# Patient Record
Sex: Male | Born: 1935 | Race: Black or African American | Hispanic: No | Smoking: Never smoker
Health system: Southern US, Community
[De-identification: ages and names within clinical notes are randomized; demographics above are authoritative.]

## PROBLEM LIST (undated history)

## (undated) DIAGNOSIS — I2510 Atherosclerotic heart disease of native coronary artery without angina pectoris: Secondary | ICD-10-CM

## (undated) DIAGNOSIS — I10 Essential (primary) hypertension: Secondary | ICD-10-CM

## (undated) DIAGNOSIS — E785 Hyperlipidemia, unspecified: Secondary | ICD-10-CM

## (undated) HISTORY — DX: Atherosclerotic heart disease of native coronary artery without angina pectoris: I25.10

## (undated) HISTORY — DX: Hyperlipidemia, unspecified: E78.5

---

## 1994-07-04 DIAGNOSIS — I2510 Atherosclerotic heart disease of native coronary artery without angina pectoris: Secondary | ICD-10-CM

## 1994-07-04 HISTORY — DX: Atherosclerotic heart disease of native coronary artery without angina pectoris: I25.10

## 1994-07-04 HISTORY — PX: CORONARY ARTERY BYPASS GRAFT: SHX141

## 2013-02-28 ENCOUNTER — Other Ambulatory Visit: Payer: Self-pay | Admitting: *Deleted

## 2013-02-28 MED ORDER — AMLODIPINE BESYLATE 5 MG PO TABS: 7.5000 mg | ORAL_TABLET | Freq: Every day | ORAL | Status: DC

## 2013-02-28 NOTE — Telephone Encounter (Signed)
Rx was sent to pharmacy electronically via [employer]

## 2013-03-08 ENCOUNTER — Other Ambulatory Visit: Payer: Self-pay | Admitting: *Deleted

## 2013-03-08 MED ORDER — ROSUVASTATIN CALCIUM 10 MG PO TABS: 10.0000 mg | ORAL_TABLET | Freq: Every day | ORAL | Status: DC

## 2013-03-08 NOTE — Telephone Encounter (Signed)
Rx was sent to pharmacy electronically via [employer]

## 2013-04-30 ENCOUNTER — Ambulatory Visit (INDEPENDENT_AMBULATORY_CARE_PROVIDER_SITE_OTHER): Payer: Medicare Other | Admitting: Cardiovascular Disease

## 2013-04-30 ENCOUNTER — Encounter: Payer: Self-pay | Admitting: Cardiovascular Disease

## 2013-04-30 VITALS — BP 138/90 | HR 63 | Ht 66.0 in | Wt 141.3 lb

## 2013-04-30 DIAGNOSIS — I2510 Atherosclerotic heart disease of native coronary artery without angina pectoris: Secondary | ICD-10-CM

## 2013-04-30 DIAGNOSIS — E785 Hyperlipidemia, unspecified: Secondary | ICD-10-CM

## 2013-04-30 DIAGNOSIS — I10 Essential (primary) hypertension: Secondary | ICD-10-CM

## 2013-04-30 DIAGNOSIS — R972 Elevated prostate specific antigen [PSA]: Secondary | ICD-10-CM

## 2013-04-30 NOTE — Patient Instructions (Signed)
Your physician has requested that you have a lexiscan myoview and office appointment in 6 MONTHS.

## 2013-06-02 ENCOUNTER — Encounter: Payer: Self-pay | Admitting: Cardiovascular Disease

## 2013-06-02 DIAGNOSIS — E785 Hyperlipidemia, unspecified: Secondary | ICD-10-CM | POA: Insufficient documentation

## 2013-06-02 DIAGNOSIS — I10 Essential (primary) hypertension: Secondary | ICD-10-CM | POA: Insufficient documentation

## 2013-06-02 DIAGNOSIS — R972 Elevated prostate specific antigen [PSA]: Secondary | ICD-10-CM | POA: Insufficient documentation

## 2013-06-02 DIAGNOSIS — I2510 Atherosclerotic heart disease of native coronary artery without angina pectoris: Secondary | ICD-10-CM | POA: Insufficient documentation

## 2013-06-02 NOTE — Progress Notes (Signed)
Patient ID: Devin Hunter, male   DOB: 1919-11-11, 79 y.o.   MRN: [id number]     PATIENT PROFILE:  Devin Hunter a 79 year old male who is a patient of Dr. Marella Chimes said and had previously seen Dr. Rollene Fare. He presents to the office today to establish cardiology care with me after Dr. Lowella Fairy retirement from his solo practice.   HPI: Devin Hunter is a 79 year old gentleman who has a history of hypertension, hyperlipidemia, and coronary artery disease. He underwent CABG revascularization surgery in 1996 and had a LIMA placed to his LAD, SVG to the OM, and SVG to the PDA. Preoperative ejection fraction was 45%. His last nuclear perfusion study was in April 2011 which revealed normal perfusion. His last echo Doppler study was April 2013 showed mild concentric LVH with grade 1 diastolic dysfunction and normal LV function with ejection fraction greater than 55%. He didn't mild mitral annular calcification, moderate tricuspid regurgitation, mild aortic valve sclerosis. He didn't mild pulmonary hypertension with an estimated RV systolic pressure of 40 mm.  Devin Hunter has continued to be very active. He still is ambulatory and over the past year was actually climbing up to his roof to clean trees off his roof. He denies any recurrent chest pain symptoms. He walks 2 miles per day. He last saw Dr. Rollene Fare in March 2014. He presents to the office today to establish neurologic care with me.  Past surgical history is notable for CABG surgery as noted above in 1996.  Past medical history is notable for hypertension, hyperlipidemia, CAD, as well as elevated PSA for which he is followed by Dr. Mar Daring   Current Outpatient Prescriptions  Medication Sig Dispense Refill  . amLODipine (NORVASC) 5 MG tablet Take 1.5 tablets (7.5 mg total) by mouth daily.  45 tablet  7  . aspirin 81 MG tablet Take 81 mg by mouth daily.      . enalapril (VASOTEC) 20 MG tablet Take 1 tablet by mouth daily.      . fish oil-omega-3  fatty acids 1000 MG capsule Take 1 g by mouth daily.      . metoprolol tartrate (LOPRESSOR) 25 MG tablet Take 1 tablet by mouth 2 (two) times daily.      . rosuvastatin (CRESTOR) 10 MG tablet Take 1 tablet (10 mg total) by mouth daily.  30 tablet  7  . vardenafil (LEVITRA) 2.5 MG tablet Take 2.5 mg by mouth daily as needed for erectile dysfunction.       No current facility-administered medications for this visit.    Socially he is widowed. He had 5 children, 3 deceased, 20 grandchildren and 7 great-grandchildren. There is no history of tobacco use.  History reviewed. No pertinent family history.  ROS is negative for fever chills or night sweats. He denies changes to skin. He denies visual changes. He denies changes to curing. He denies cough or increased sputum or congestion. There is no wheezing. He denies presyncope or syncope. He is unaware lymphadenopathy. He denies anginal symptoms. He denies abdominal pain, nausea vomiting. He denies blood in stool or urine. He does intermittently use Levitra erectile function movement. He does have a history of hyperlipidemia. He denies myalgias. He denies paresthesias. He does have elevation of PSA in the past. He denies neurologic symptoms. He denies cold or heat intolerance. He denies any significant problems with sleep. Other comprehensive 14 point system review is negative.  PE BP 138/90  Pulse 63  Ht 5\' 6"  (1.676 m)  Wt  141 lb 4.8 oz (64.093 kg)  BMI 22.82 kg/m2 Repeat blood pressure when taken by me was 138/88 General: Alert, oriented, no distress. He appears at least 10-15 years younger than his stated age. Skin: normal turgor, no rashes HEENT: Normocephalic, atraumatic. Pupils round and reactive; sclera anicteric; Fundi mild arteriolar narrowing without hemorrhages or exudates. Nose without nasal septal hypertrophy Mouth/Parynx benign; Mallinpatti scale 2 Neck: No JVD, no carotid bruits Lungs: clear to ausculatation and percussion; no  wheezing or rales Heart: RRR, s1 s2 normal 1/6 systolic murmur Abdomen: soft, nontender; no hepatosplenomehaly, BS+; abdominal aorta nontender and not dilated by palpation. Pulses 2+ Extremities: no clubbinbg cyanosis or edema, Homan's sign negative  Neurologic: grossly nonfocal Psychologic: Normal mood and affect   ECG: Normal sinus rhythm with mild sinus arrhythmia. Normal intervals.  LABS:  BMET No results found for this basename: na, k, cl, co2, glucose, bun, creatinine, calcium, gfrnonaa, gfraa     Hepatic Function Panel  No results found for this basename: prot, albumin, ast, alt, alkphos, bilitot, bilidir, ibili     CBC No results found for this basename: wbc, rbc, hgb, hct, plt, mcv, mch, mchc, rdw, neutrabs, lymphsabs, monoabs, eosabs, basosabs     BNP No results found for this basename: probnp    Lipid Panel  No results found for this basename: chol, trig, hdl, cholhdl, vldl, ldlcalc     RADIOLOGY: No results found.   ASSESSMENT AND PLAN: My impression is that Devin Hunter is a very healthy-appearing 79 year old gentleman who appears at least 10-15 years younger than his stated age. He has undergone CABG revascularization surgery 18 years ago in 1996 and his last nuclear perfusion study in April 2011 continued to show normal perfusion. I did review the laboratory that he had done in March 2004. BUN was 15 creatinine 1.2. Total cholesterol was 149 triglycerides 61 HDL 46 LDL 91. Calcium was minimally elevated at 10.8. Devin Hunter is walking at least 2 miles per day. He remains very active. His blood pressure today is stable. In April 2015 I am recommending he undergo a 4 year followup nuclear perfusion study. I will also check laboratory that time one year after his last laboratory assessment. I'll see him back in the office for followup and further recommendations were made at that time.   Troy Sine, MD, [facility] 06/02/2013 8:58 AM

## 2013-06-03 ENCOUNTER — Encounter: Payer: Self-pay | Admitting: Cardiovascular Disease

## 2013-08-19 ENCOUNTER — Other Ambulatory Visit: Payer: Self-pay | Admitting: *Deleted

## 2013-08-19 MED ORDER — ENALAPRIL MALEATE 20 MG PO TABS: 20.0000 mg | ORAL_TABLET | Freq: Every day | ORAL | Status: DC

## 2013-08-19 NOTE — Telephone Encounter (Signed)
Rx was sent to pharmacy electronically. 

## 2013-08-26 ENCOUNTER — Telehealth: Payer: Self-pay | Admitting: *Deleted

## 2013-08-26 MED ORDER — ENALAPRIL MALEATE 20 MG PO TABS: 20.0000 mg | ORAL_TABLET | Freq: Every day | ORAL | Status: DC

## 2013-08-26 NOTE — Telephone Encounter (Signed)
Spoke w/ Dr. Claiborne Billings and he advised pt should be on enalapril 20 mg daily.  Rx sent to pharmacy.  Call to pt and no answer.  RN previously advised pt to check w/ pharmacy.

## 2013-08-26 NOTE — Telephone Encounter (Signed)
Walk-In Message r/t problems getting Rx called in.  Returned call.  Pt stated he needs a refill on amlodipine (spelled).  Pt confirmed dose.  Informed refill would be sent.  Pt also informed refill request was sent to [facility] for enalapril.  Pt stated he doesn't have that bottle and hasn't been taking it.  Pt informed RN will call pharmacy to review meds to make sure right Rx sent to pharmacy.  Pt stated [facility] not use [facility], but [facility].  Call to [facility] and meds reviewed.  Informed pt filled amlodipine on 2.9.15 and request for enalapril 20 mg (3 tabs daily) was sent last week w/o response.  Stated pt does not need amlodipine.  Informed pharmacist that refill was sent for enalapril 20 mg daily to [facility].  Pharmacist stated pt has been consistently filling Rxs there and only by Dr. Rollene Fare.  Informed RN will clarify dose of enalapril and send back electronically.  Verbalized understanding.  Call to pt and informed RN will get Rx situated and send in correct refill.  Pt advised to check w/ pharmacy in a couple of hours before arriving to pick up Rx.  Pt verbalized understanding and agreed w/ plan.  This message and notes left on Dr. Evette Georges cart for review and advice.

## 2013-09-25 ENCOUNTER — Other Ambulatory Visit: Payer: Self-pay | Admitting: *Deleted

## 2013-09-25 MED ORDER — ENALAPRIL MALEATE 20 MG PO TABS: 20.0000 mg | ORAL_TABLET | Freq: Every day | ORAL | Status: DC

## 2013-11-11 ENCOUNTER — Other Ambulatory Visit: Payer: Self-pay

## 2013-11-11 MED ORDER — ROSUVASTATIN CALCIUM 10 MG PO TABS: 10.0000 mg | ORAL_TABLET | Freq: Every day | ORAL | Status: DC

## 2013-11-11 NOTE — Telephone Encounter (Signed)
Rx was sent to pharmacy electronically. 

## 2014-03-20 ENCOUNTER — Other Ambulatory Visit: Payer: Self-pay | Admitting: *Deleted

## 2014-03-20 MED ORDER — AMLODIPINE BESYLATE 5 MG PO TABS: 7.5000 mg | ORAL_TABLET | Freq: Every day | ORAL | Status: DC

## 2014-04-07 ENCOUNTER — Other Ambulatory Visit: Payer: Self-pay | Admitting: *Deleted

## 2014-04-07 MED ORDER — METOPROLOL TARTRATE 25 MG PO TABS: 25.0000 mg | ORAL_TABLET | Freq: Two times a day (BID) | ORAL | Status: DC

## 2014-04-07 NOTE — Telephone Encounter (Signed)
Refill sent electronically  to the pharmacy

## 2014-04-18 ENCOUNTER — Other Ambulatory Visit: Payer: Self-pay

## 2014-04-18 MED ORDER — ENALAPRIL MALEATE 20 MG PO TABS: 20.0000 mg | ORAL_TABLET | Freq: Every day | ORAL | Status: DC

## 2014-04-18 NOTE — Telephone Encounter (Signed)
Rx sent to pharmacy   

## 2014-05-14 ENCOUNTER — Other Ambulatory Visit: Payer: Self-pay

## 2014-05-14 MED ORDER — ENALAPRIL MALEATE 20 MG PO TABS: 20.0000 mg | ORAL_TABLET | Freq: Every day | ORAL | Status: DC

## 2014-05-14 NOTE — Telephone Encounter (Signed)
Rx sent to pharmacy   

## 2014-05-15 ENCOUNTER — Ambulatory Visit (INDEPENDENT_AMBULATORY_CARE_PROVIDER_SITE_OTHER): Payer: Medicare Other | Admitting: Cardiovascular Disease

## 2014-05-15 ENCOUNTER — Encounter: Payer: Self-pay | Admitting: Cardiovascular Disease

## 2014-05-15 VITALS — BP 120/80 | HR 61 | Ht 68.0 in | Wt 146.1 lb

## 2014-05-15 DIAGNOSIS — Z79899 Other long term (current) drug therapy: Secondary | ICD-10-CM

## 2014-05-15 DIAGNOSIS — E785 Hyperlipidemia, unspecified: Secondary | ICD-10-CM

## 2014-05-15 DIAGNOSIS — N529 Male erectile dysfunction, unspecified: Secondary | ICD-10-CM | POA: Insufficient documentation

## 2014-05-15 DIAGNOSIS — R972 Elevated prostate specific antigen [PSA]: Secondary | ICD-10-CM

## 2014-05-15 DIAGNOSIS — E782 Mixed hyperlipidemia: Secondary | ICD-10-CM

## 2014-05-15 DIAGNOSIS — I2510 Atherosclerotic heart disease of native coronary artery without angina pectoris: Secondary | ICD-10-CM

## 2014-05-15 DIAGNOSIS — I10 Essential (primary) hypertension: Secondary | ICD-10-CM

## 2014-05-15 LAB — CBC
HEMATOCRIT: 42.6 % (ref 39.0–52.0)
Hemoglobin: 13.9 g/dL (ref 13.0–17.0)
MCH: 29 pg (ref 26.0–34.0)
MCHC: 32.6 g/dL (ref 30.0–36.0)
MCV: 88.9 fL (ref 78.0–100.0)
Platelets: 323 10*3/uL (ref 150–400)
RBC: 4.79 MIL/uL (ref 4.22–5.81)
RDW: 14.1 % (ref 11.5–15.5)
WBC: 9.2 10*3/uL (ref 4.0–10.5)

## 2014-05-15 NOTE — Progress Notes (Signed)
Patient ID: Devin Hunter, male   DOB: 11-07-1919, 80 y.o.   MRN: [id number]     HPI:  Devin Hunter is a 80 year old AA male who presents for one-year cardiology follow-up evaluation.   Devin Hunter has a history of hypertension, hyperlipidemia, and coronary artery disease. He underwent CABG revascularization surgery in 1996 (LIMA placed to his LAD, SVG to the OM, and SVG to the PDA). Preoperative ejection fraction was 45%. His last nuclear perfusion study was in April 2011 which revealed normal perfusion. His last echo Doppler study was April 2013 showed mild concentric LVH with grade 1 diastolic dysfunction and normal LV function with ejection fraction greater than 55%. He didn't mild mitral annular calcification, moderate tricuspid regurgitation, mild aortic valve sclerosis. He didn't mild pulmonary hypertension with an estimated RV systolic pressure of 40 mm.  Devin Hunter has continued to be very active. He walks at least 2 miles a day at least 3 days per week.  Last year he was climbing up on his roof to clean trees off his roof.  He denies any change in exercise capacity.  He denies chest pain.  He denies palpitations.  He denies exertional shortness of breath.  He is unaware of wheezing.  Past surgical history is notable for CABG surgery as noted above in 1996.  Past medical history is notable for hypertension, hyperlipidemia, CAD, as well as elevated PSA for which he is followed by Dr. Mar Daring   Current Outpatient Prescriptions  Medication Sig Dispense Refill  . amLODipine (NORVASC) 5 MG tablet Take 1.5 tablets (7.5 mg total) by mouth daily. NEED AN APPOINTMENT BEFORE NEXT REFILL. 45 tablet 6  . aspirin 81 MG tablet Take 81 mg by mouth daily.    . enalapril (VASOTEC) 20 MG tablet Take 1 tablet (20 mg total) by mouth daily. 15 tablet 0  . fish oil-omega-3 fatty acids 1000 MG capsule Take 1 g by mouth daily.    . metoprolol tartrate (LOPRESSOR) 25 MG tablet Take 1 tablet (25 mg total) by  mouth 2 (two) times daily. Pt MUST keep appointment for future refills. 60 tablet 1  . rosuvastatin (CRESTOR) 10 MG tablet Take 1 tablet (10 mg total) by mouth daily. 30 tablet 5  . vardenafil (LEVITRA) 2.5 MG tablet Take 2.5 mg by mouth daily as needed for erectile dysfunction.     No current facility-administered medications for this visit.    Socially he is widowed. He had 5 children, 3 deceased, 20 grandchildren and 7 great-grandchildren. There is no history of tobacco use.  History reviewed. No pertinent family history.  ROS General: Negative; No fevers, chills, or night sweats;  HEENT: Negative; No changes in vision or hearing, sinus congestion, difficulty swallowing Pulmonary: Negative; No cough, wheezing, shortness of breath, hemoptysis Cardiovascular: Negative; No chest pain, presyncope, syncope, palpitations GI: Negative; No nausea, vomiting, diarrhea, or abdominal pain GU: erectile dysfunction for which she uses Levitra on an as-needed basis; No dysuria, hematuria, or difficulty voiding Musculoskeletal: Negative; no myalgias, joint pain, or weakness Hematologic/Oncology: Negative; no easy bruising, bleeding Endocrine: Negative; no heat/cold intolerance; no diabetes Neuro: Negative; no changes in balance, headaches Skin: Negative; No rashes or skin lesions Psychiatric: Negative; No behavioral problems, depression Sleep: Negative; No snoring, daytime sleepiness, hypersomnolence, bruxism, restless legs, hypnogognic hallucinations, no cataplexy Other comprehensive 14 point system review is negative.   PE BP 120/80 mmHg  Pulse 61  Ht 5\' 8"  (1.727 m)  Wt 146 lb 1.6 oz (66.271 kg)  BMI  22.22 kg/m2 Repeat blood pressure when taken by me was 130/74 General: Alert, oriented, no distress. He appears significantly younger than his stated age. Skin: normal turgor, no rashes HEENT: Normocephalic, atraumatic. Pupils round and reactive; sclera anicteric; Fundi mild arteriolar  narrowing without hemorrhages or exudates. Nose without nasal septal hypertrophy Mouth/Parynx benign; Mallinpatti scale 2 Neck: No JVD, no carotid bruits; normal carotid upstroke Lungs: clear to ausculatation and percussion; no wheezing or rales Chest wall: Nontender to palpation Heart: RRR, s1 s2 normal 1/6 systolic murmur; .  No diastolic murmur.  No S3 gallop.  No rubs thrills or heaves. Abdomen: soft, nontender; no hepatosplenomehaly, BS+; abdominal aorta nontender and not dilated by palpation. Back: No CVA tenderness Pulses 2+ Extremities: no clubbinbg cyanosis or edema, Homan's sign negative  Neurologic: grossly nonfocal Psychologic: Normal mood and affect; normal cognitive function  ECG (independently read by me): Normal sinus rhythm at 61 bpm; no ectopy.  QTc interval 378 ms.  PR interval 170 ms  October 2014 ECG: Normal sinus rhythm with mild sinus arrhythmia. Normal intervals.  LABS:  BMET No results found for: NA   Hepatic Function Panel  No results found for: PROT   CBC No results found for: WBC   BNP No results found for: PROBNP  Lipid Panel  No results found for: CHOL   RADIOLOGY: No results found.   ASSESSMENT AND PLAN:   Devin Hunter is a very young and healthy-appearing 80 year old gentleman who underwent CABG revascularization surgery 19 years ago in 1996.  His last nuclear perfusion study in April 2011 continued to show normal perfusion. When I saw him one year ago, I had recommended a 4 year follow-up nuclear perfusion study.  Apparently this was never done.  He continues to be very active and walks up to 2 miles per day at minimum 3 days per week.  He is fasting today.  He is on enalapril 20 mg and Lopressor 25 mg twice a day and amlodipine 7.5 mg for hypertension.  He is taking Crestor 10 mg for hyperlipidemia.  He takes Levitra 2.5 mg as needed for erectile dysfunction. His blood pressure is controlled.  Target LDL is less than 70. He is not having  any symptoms of angina.  He denies exertional dyspnea or change in exercise tolerance. He is not having any CHF symptoms.  He's not having palpitations.  I will contact him regarding his blood work and if adjustments are to be made to his medical regimen.  As long as he remains stable I will see him in one year for cardiology reevaluation or sooner if problems arise.  Time spent: 25 minutes Troy Sine, MD, [facility] 05/15/2014 11:06 AM

## 2014-05-15 NOTE — Patient Instructions (Signed)
Your physician wants you to follow-up in: 1 year or sooner if needed with Dr. Claiborne Billings. No changes were made today in your therapy. You will receive a reminder letter in the mail two months in advance. If you don't receive a letter, please call our office to schedule the follow-up appointment.   Your physician recommends that you return for lab work fasting today.

## 2014-05-16 LAB — COMPREHENSIVE METABOLIC PANEL
ALT: <8 U/L (ref 0–53)
AST: 15 U/L (ref 0–37)
Albumin: 4.1 g/dL (ref 3.5–5.2)
Alkaline Phosphatase: 96 U/L (ref 39–117)
BILIRUBIN TOTAL: 0.6 mg/dL (ref 0.2–1.2)
BUN: 11 mg/dL (ref 6–23)
CO2: 25 meq/L (ref 19–32)
CREATININE: 1.14 mg/dL (ref 0.50–1.35)
Calcium: 9.6 mg/dL (ref 8.4–10.5)
Chloride: 104 mEq/L (ref 96–112)
Glucose, Bld: 93 mg/dL (ref 70–99)
Potassium: 4.4 mEq/L (ref 3.5–5.3)
SODIUM: 140 meq/L (ref 135–145)
Total Protein: 7.1 g/dL (ref 6.0–8.3)

## 2014-05-16 LAB — LIPID PANEL
Cholesterol: 136 mg/dL (ref 0–200)
HDL: 38 mg/dL — AB (ref >39)
LDL CALC: 85 mg/dL (ref 0–99)
Total CHOL/HDL Ratio: 3.6 Ratio
Triglycerides: 67 mg/dL (ref <150)
VLDL: 13 mg/dL (ref 0–40)

## 2014-05-16 LAB — TSH: TSH: 1.616 u[IU]/mL (ref 0.350–4.500)

## 2014-05-21 ENCOUNTER — Other Ambulatory Visit: Payer: Self-pay | Admitting: *Deleted

## 2014-05-21 MED ORDER — ROSUVASTATIN CALCIUM 10 MG PO TABS: 10.0000 mg | ORAL_TABLET | Freq: Every day | ORAL | Status: DC

## 2014-05-21 NOTE — Telephone Encounter (Signed)
Refilled electronically 

## 2014-05-23 ENCOUNTER — Encounter: Payer: Self-pay | Admitting: *Deleted

## 2014-05-27 ENCOUNTER — Other Ambulatory Visit: Payer: Self-pay

## 2014-05-27 MED ORDER — ENALAPRIL MALEATE 20 MG PO TABS: 20.0000 mg | ORAL_TABLET | Freq: Every day | ORAL | Status: DC

## 2014-05-27 NOTE — Telephone Encounter (Signed)
Rx sent to pharmacy   

## 2014-07-28 ENCOUNTER — Other Ambulatory Visit: Payer: Self-pay

## 2014-07-28 MED ORDER — METOPROLOL TARTRATE 25 MG PO TABS: 25.0000 mg | ORAL_TABLET | Freq: Two times a day (BID) | ORAL | Status: DC

## 2014-07-28 NOTE — Telephone Encounter (Signed)
Rx sent to pharmacy   

## 2014-10-08 DIAGNOSIS — H4011X2 Primary open-angle glaucoma, moderate stage: Secondary | ICD-10-CM | POA: Diagnosis not present

## 2014-10-08 DIAGNOSIS — H31091 Other chorioretinal scars, right eye: Secondary | ICD-10-CM | POA: Diagnosis not present

## 2014-10-08 DIAGNOSIS — H11001 Unspecified pterygium of right eye: Secondary | ICD-10-CM | POA: Diagnosis not present

## 2014-10-08 DIAGNOSIS — Z961 Presence of intraocular lens: Secondary | ICD-10-CM | POA: Diagnosis not present

## 2014-10-08 DIAGNOSIS — H4011X3 Primary open-angle glaucoma, severe stage: Secondary | ICD-10-CM | POA: Diagnosis not present

## 2014-10-16 DIAGNOSIS — Z961 Presence of intraocular lens: Secondary | ICD-10-CM | POA: Diagnosis not present

## 2014-10-16 DIAGNOSIS — H4011X2 Primary open-angle glaucoma, moderate stage: Secondary | ICD-10-CM | POA: Diagnosis not present

## 2014-10-16 DIAGNOSIS — H4011X3 Primary open-angle glaucoma, severe stage: Secondary | ICD-10-CM | POA: Diagnosis not present

## 2014-10-16 DIAGNOSIS — H11001 Unspecified pterygium of right eye: Secondary | ICD-10-CM | POA: Diagnosis not present

## 2014-11-13 ENCOUNTER — Other Ambulatory Visit: Payer: Self-pay | Admitting: *Deleted

## 2014-11-13 MED ORDER — METOPROLOL TARTRATE 25 MG PO TABS: 25.0000 mg | ORAL_TABLET | Freq: Two times a day (BID) | ORAL | Status: DC

## 2015-01-22 ENCOUNTER — Encounter: Payer: Self-pay | Admitting: Cardiovascular Disease

## 2015-01-26 ENCOUNTER — Other Ambulatory Visit: Payer: Self-pay

## 2015-01-26 ENCOUNTER — Other Ambulatory Visit: Payer: Self-pay | Admitting: Cardiology

## 2015-01-26 MED ORDER — AMLODIPINE BESYLATE 5 MG PO TABS: 7.5000 mg | ORAL_TABLET | Freq: Every day | ORAL | Status: DC

## 2015-03-23 ENCOUNTER — Other Ambulatory Visit: Payer: Self-pay | Admitting: Cardiovascular Disease

## 2015-03-23 NOTE — Telephone Encounter (Signed)
Rx request sent to pharmacy.  

## 2015-03-27 ENCOUNTER — Other Ambulatory Visit: Payer: Self-pay | Admitting: Cardiovascular Disease

## 2015-03-27 NOTE — Telephone Encounter (Signed)
REFILL 

## 2015-04-21 ENCOUNTER — Other Ambulatory Visit: Payer: Self-pay

## 2015-04-21 MED ORDER — METOPROLOL TARTRATE 25 MG PO TABS: 25.0000 mg | ORAL_TABLET | Freq: Two times a day (BID) | ORAL | Status: DC

## 2015-05-18 ENCOUNTER — Other Ambulatory Visit: Payer: Self-pay | Admitting: Cardiovascular Disease

## 2015-05-30 ENCOUNTER — Other Ambulatory Visit: Payer: Self-pay | Admitting: Cardiovascular Disease

## 2015-06-01 NOTE — Telephone Encounter (Signed)
Rx(s) sent to pharmacy electronically.  

## 2015-06-25 ENCOUNTER — Other Ambulatory Visit: Payer: Self-pay

## 2015-06-25 MED ORDER — METOPROLOL TARTRATE 25 MG PO TABS: 25.0000 mg | ORAL_TABLET | Freq: Two times a day (BID) | ORAL | Status: DC

## 2015-08-10 ENCOUNTER — Ambulatory Visit: Payer: Self-pay | Admitting: Cardiovascular Disease

## 2015-08-11 ENCOUNTER — Encounter: Payer: Self-pay | Admitting: *Deleted

## 2015-09-05 ENCOUNTER — Emergency Department (HOSPITAL_COMMUNITY)
Admission: EM | Admit: 2015-09-05 | Discharge: 2015-09-06 | Disposition: A | Discharge status: Home / Self Care | Payer: Medicare Other | Attending: Emergency Medicine | Admitting: Emergency Medicine

## 2015-09-05 ENCOUNTER — Emergency Department (HOSPITAL_COMMUNITY): Payer: Medicare Other

## 2015-09-05 ENCOUNTER — Encounter (HOSPITAL_COMMUNITY): Payer: Self-pay | Admitting: Emergency Medicine

## 2015-09-05 DIAGNOSIS — Z951 Presence of aortocoronary bypass graft: Secondary | ICD-10-CM | POA: Diagnosis not present

## 2015-09-05 DIAGNOSIS — Z7982 Long term (current) use of aspirin: Secondary | ICD-10-CM | POA: Diagnosis not present

## 2015-09-05 DIAGNOSIS — R410 Disorientation, unspecified: Secondary | ICD-10-CM | POA: Insufficient documentation

## 2015-09-05 DIAGNOSIS — N390 Urinary tract infection, site not specified: Secondary | ICD-10-CM | POA: Insufficient documentation

## 2015-09-05 DIAGNOSIS — Z79899 Other long term (current) drug therapy: Secondary | ICD-10-CM | POA: Diagnosis not present

## 2015-09-05 DIAGNOSIS — R4182 Altered mental status, unspecified: Secondary | ICD-10-CM | POA: Diagnosis present

## 2015-09-05 DIAGNOSIS — R531 Weakness: Secondary | ICD-10-CM | POA: Insufficient documentation

## 2015-09-05 DIAGNOSIS — I10 Essential (primary) hypertension: Secondary | ICD-10-CM | POA: Diagnosis not present

## 2015-09-05 HISTORY — DX: Essential (primary) hypertension: I10

## 2015-09-05 LAB — CBC WITH DIFFERENTIAL/PLATELET
BASOS PCT: 0 %
Basophils Absolute: 0 10*3/uL (ref 0.0–0.1)
EOS ABS: 0 10*3/uL (ref 0.0–0.7)
EOS PCT: 0 %
HCT: 39.3 % (ref 39.0–52.0)
Hemoglobin: 12.6 g/dL — ABNORMAL LOW (ref 13.0–17.0)
LYMPHS ABS: 1.3 10*3/uL (ref 0.7–4.0)
Lymphocytes Relative: 8 %
MCH: 29.3 pg (ref 26.0–34.0)
MCHC: 32.1 g/dL (ref 30.0–36.0)
MCV: 91.4 fL (ref 78.0–100.0)
MONOS PCT: 6 %
Monocytes Absolute: 1 10*3/uL (ref 0.1–1.0)
Neutro Abs: 13.5 10*3/uL — ABNORMAL HIGH (ref 1.7–7.7)
Neutrophils Relative %: 86 %
PLATELETS: 252 10*3/uL (ref 150–400)
RBC: 4.3 MIL/uL (ref 4.22–5.81)
RDW: 15 % (ref 11.5–15.5)
WBC: 15.8 10*3/uL — AB (ref 4.0–10.5)

## 2015-09-05 LAB — URINALYSIS, ROUTINE W REFLEX MICROSCOPIC
Bilirubin Urine: NEGATIVE
Glucose, UA: NEGATIVE mg/dL
Ketones, ur: NEGATIVE mg/dL
Leukocytes, UA: NEGATIVE
Nitrite: POSITIVE — AB
PROTEIN: 100 mg/dL — AB
Specific Gravity, Urine: 1.021 (ref 1.005–1.030)
pH: 5 (ref 5.0–8.0)

## 2015-09-05 LAB — URINE MICROSCOPIC-ADD ON

## 2015-09-05 LAB — COMPREHENSIVE METABOLIC PANEL
ALK PHOS: 80 U/L (ref 38–126)
ALT: 13 U/L — AB (ref 17–63)
AST: 24 U/L (ref 15–41)
Albumin: 3.7 g/dL (ref 3.5–5.0)
Anion gap: 14 (ref 5–15)
BILIRUBIN TOTAL: 1 mg/dL (ref 0.3–1.2)
BUN: 18 mg/dL (ref 6–20)
CALCIUM: 9.5 mg/dL (ref 8.9–10.3)
CO2: 21 mmol/L — AB (ref 22–32)
CREATININE: 1.4 mg/dL — AB (ref 0.61–1.24)
Chloride: 105 mmol/L (ref 101–111)
GFR calc non Af Amer: 42 mL/min — ABNORMAL LOW (ref >60)
GFR, EST AFRICAN AMERICAN: 48 mL/min — AB (ref >60)
Glucose, Bld: 145 mg/dL — ABNORMAL HIGH (ref 65–99)
Potassium: 3.9 mmol/L (ref 3.5–5.1)
SODIUM: 140 mmol/L (ref 135–145)
TOTAL PROTEIN: 7.2 g/dL (ref 6.5–8.1)

## 2015-09-05 LAB — I-STAT CG4 LACTIC ACID, ED: Lactic Acid, Venous: 1.63 mmol/L (ref 0.5–2.0)

## 2015-09-05 MED ORDER — SODIUM CHLORIDE 0.9 % IV BOLUS (SEPSIS)
1000.0000 mL | Freq: Once | INTRAVENOUS | Status: AC
  Administered 2015-09-05: 1000 mL via INTRAVENOUS

## 2015-09-05 NOTE — ED Notes (Signed)
Spoke to [employer] from lab who is aware of birthday change in chart. MRN number is the same.

## 2015-09-05 NOTE — ED Provider Notes (Signed)
CSN: [id number]     Arrival date & time 09/05/15  2008 History   First MD Initiated Contact with Patient 09/05/15 2144     Chief Complaint  Patient presents with  . Altered Mental Status    Patient is a 82 y.o. male presenting with altered mental status.  Altered Mental Status Presenting symptoms: confusion   Severity:  Mild Most recent episode:  Today Episode history:  Single Duration:  1 day Progression:  Partially resolved Chronicity:  New Context: not alcohol use, not dementia, not drug use, not head injury, not a nursing home resident, not a recent illness and not a recent infection   Associated symptoms: weakness   Associated symptoms: no abdominal pain, no agitation, no bladder incontinence, no fever, no headaches, no light-headedness, no nausea, no palpitations, no rash and no vomiting     Past Medical History  Diagnosis Date  . Hypertension    Past Surgical History  Procedure Laterality Date  . Coronary artery bypass graft     No family history on file. Social History  Substance Use Topics  . Smoking status: Never Smoker   . Smokeless tobacco: Never Used  . Alcohol Use: No    Review of Systems  Constitutional: Positive for chills. Negative for fever, activity change and appetite change.  HENT: Negative for congestion, dental problem, ear pain, facial swelling, hearing loss, rhinorrhea, sneezing, sore throat, trouble swallowing and voice change.   Eyes: Negative for photophobia, pain, redness and visual disturbance.  Respiratory: Negative for apnea, cough, chest tightness, shortness of breath, wheezing and stridor.   Cardiovascular: Negative for chest pain, palpitations and leg swelling.  Gastrointestinal: Negative for nausea, vomiting, abdominal pain, diarrhea, constipation, blood in stool and abdominal distention.  Endocrine: Negative for polydipsia and polyuria.  Genitourinary: Negative for bladder incontinence, frequency, hematuria, flank pain, decreased urine  volume and difficulty urinating.  Musculoskeletal: Negative for back pain, joint swelling, gait problem, neck pain and neck stiffness.  Skin: Negative for rash and wound.  Allergic/Immunologic: Negative for immunocompromised state.  Neurological: Positive for weakness. Negative for dizziness, syncope, facial asymmetry, speech difficulty, light-headedness, numbness and headaches.  Hematological: Negative for adenopathy.  Psychiatric/Behavioral: Positive for confusion. Negative for suicidal ideas, behavioral problems, sleep disturbance and agitation. The patient is not nervous/anxious.   All other systems reviewed and are negative.     Allergies  Review of patient's allergies indicates no known allergies.  Home Medications   Prior to Admission medications   Medication Sig Start Date End Date Taking? Authorizing Provider  amLODipine (NORVASC) 5 MG tablet Take 1.5 tablets (7.5 mg total) by mouth daily. NEED AN APPOINTMENT BEFORE NEXT REFILL. 01/26/15  Yes Troy Sine, MD  aspirin 81 MG tablet Take 81 mg by mouth daily.   Yes Historical Provider, MD  enalapril (VASOTEC) 20 MG tablet take 1 tablet by mouth once daily 06/01/15  Yes Troy Sine, MD  latanoprost (XALATAN) 0.005 % ophthalmic solution Place 1 drop into both eyes at bedtime.   Yes Historical Provider, MD  metoprolol tartrate (LOPRESSOR) 25 MG tablet Take 1 tablet (25 mg total) by mouth 2 (two) times daily. 06/25/15  Yes Troy Sine, MD  rosuvastatin (CRESTOR) 10 MG tablet Take 1 tablet (10 mg total) by mouth daily. NEED OV. 03/27/15  Yes Troy Sine, MD  vardenafil (LEVITRA) 2.5 MG tablet Take 2.5 mg by mouth daily as needed for erectile dysfunction.   Yes Historical Provider, MD  cephALEXin (KEFLEX) 500 MG capsule  Take 1 capsule (500 mg total) by mouth 2 (two) times daily. 09/06/15 09/13/15  Vira Blanco, MD   BP 103/57 mmHg  Pulse 81  Temp(Src) 98.2 F (36.8 C) (Oral)  Resp 21  Ht 5\' 7"  (1.702 m)  Wt 63.504 kg  BMI  21.92 kg/m2  SpO2 97% Physical Exam  Constitutional: He is oriented to person, place, and time. He appears well-developed and well-nourished. No distress.  HENT:  Head: Normocephalic and atraumatic.  Right Ear: External ear normal.  Left Ear: External ear normal.  Eyes: Pupils are equal, round, and reactive to light. Right eye exhibits no discharge. Left eye exhibits no discharge.  Neck: Normal range of motion. No JVD present. No tracheal deviation present.  Cardiovascular: Normal rate, regular rhythm and normal heart sounds.  Exam reveals no friction rub.   No murmur heard. Pulmonary/Chest: Effort normal and breath sounds normal. No stridor. No respiratory distress. He has no wheezes.  Abdominal: Soft. Bowel sounds are normal. He exhibits no distension. There is no rebound and no guarding.  Musculoskeletal: Normal range of motion. He exhibits no edema or tenderness.  Lymphadenopathy:    He has no cervical adenopathy.  Neurological: He is alert and oriented to person, place, and time. No cranial nerve deficit. Coordination normal.  Skin: Skin is warm and dry. No rash noted. No pallor.  Psychiatric: He has a normal mood and affect. His behavior is normal. Judgment and thought content normal.  Nursing note and vitals reviewed.   ED Course  Procedures (including critical care time) Labs Review Labs Reviewed  COMPREHENSIVE METABOLIC PANEL - Abnormal; Notable for the following:    CO2 21 (*)    Glucose, Bld 145 (*)    Creatinine, Ser 1.40 (*)    ALT 13 (*)    GFR calc non Af Amer 42 (*)    GFR calc Af Amer 48 (*)    All other components within normal limits  CBC WITH DIFFERENTIAL/PLATELET - Abnormal; Notable for the following:    WBC 15.8 (*)    Hemoglobin 12.6 (*)    Neutro Abs 13.5 (*)    All other components within normal limits  URINALYSIS, ROUTINE W REFLEX MICROSCOPIC (NOT AT [facility]) - Abnormal; Notable for the following:    APPearance TURBID (*)    Hgb urine dipstick MODERATE  (*)    Protein, ur 100 (*)    Nitrite POSITIVE (*)    All other components within normal limits  URINE MICROSCOPIC-ADD ON - Abnormal; Notable for the following:    Squamous Epithelial / LPF 0-5 (*)    Bacteria, UA MANY (*)    Casts GRANULAR CAST (*)    All other components within normal limits  CULTURE, BLOOD (ROUTINE X 2)  CULTURE, BLOOD (ROUTINE X 2)  URINE CULTURE  INFLUENZA PANEL BY PCR (TYPE A & B, H1N1)  I-STAT CG4 LACTIC ACID, ED    Imaging Review Dg Chest 2 View  09/05/2015  CLINICAL DATA:  82 year old male with altered mental status and cough EXAM: CHEST  2 VIEW COMPARISON:  None. FINDINGS: Two views of the chest demonstrate minimal bibasilar atelectasis/ scarring. There is no focal consolidation, pleural effusion, or pneumothorax. The cardiac silhouette is within normal limits. Median sternotomy wires and CABG vascular clips noted. No acute osseous pathology. IMPRESSION: No active cardiopulmonary disease. Electronically Signed   By: Anner Crete M.D.   On: 09/05/2015 20:55   Ct Head Wo Contrast  09/05/2015  CLINICAL DATA:  Altered mental  status EXAM: CT HEAD WITHOUT CONTRAST TECHNIQUE: Contiguous axial images were obtained from the base of the skull through the vertex without intravenous contrast. COMPARISON:  None FINDINGS: Prominence of the sulci and ventricles identified compatible with brain atrophy. Mild low attenuation within the subcortical and periventricular white matter is identified consistent with chronic microvascular disease. There is no abnormal extra-axial fluid collection, intracranial hemorrhage or mass noted. The paranasal sinuses and the mastoid air cells are clear. The calvarium is intact. IMPRESSION: 1. No acute intracranial abnormalities. 2. Chronic microvascular disease and brain atrophy. Electronically Signed   By: Kerby Moors M.D.   On: 09/05/2015 23:56   I have personally reviewed and evaluated these images and lab results as part of my medical  decision-making.   EKG Interpretation None      MDM   Final diagnoses:  UTI (lower urinary tract infection)    Patient presents with family for evaluation of chills, generalized weakness, altered mental status. Patient with history of CAD, hypertension. Family spoke with patient on phone yesterday he said he was cold despite having heater on. Today he was found sitting in his house with shirt and boxers on.  Upon arrival, patient afebrile no acute distress. He is alert, oriented 3, GCS 15. Patient normal heart-lung abdominal exam. No CVA tenderness. Normal cranial nerve exam and normal neurologic exam.  CT head with no acute abnormalities, chest x-ray normal.    CBC with white blood cell count 15.8. CMP at baseline. Blood culture 2 pending. UA with positive nitrates, many bacteria. Consistent with UTI. Lactate normal at 1.63.  I discussed with patient and family findings of workup which included urinary tract infection. Patient continued to be alert, oriented 3. He had his family at bedside and discussed possible admission versus discharge. Patient and family would prefer to be discharged. We will give 1 dose of IV Rocephin to be given in the emergency department. Patient will be discharged with Keflex for treatment of UTI. Patient family encourage return the emergency department patient's altered mental status worsen due to failure of outpatient treatment.  Patient was by himself was able to stay with family for the next 2-3 nights to help assure that he is clinically improving.  I discussed patient with my attending, Dr. Rogene Houston.      Vira Blanco, MD 09/06/15 [phone]

## 2015-09-05 NOTE — ED Notes (Signed)
Family reports that they spoke to pt yesterday and he was c/o being cold and stated that he had a cold. Today they went to check on him and found him in his boxers and not acting like himself. Pt smells of urine.

## 2015-09-06 MED ORDER — DEXTROSE 5 % IV SOLN
1.0000 g | Freq: Once | INTRAVENOUS | Status: AC
  Administered 2015-09-06: 1 g via INTRAVENOUS
  Filled 2015-09-06: qty 10

## 2015-09-06 MED ORDER — CEPHALEXIN 500 MG PO CAPS: 500.0000 mg | ORAL_CAPSULE | Freq: Two times a day (BID) | ORAL | Status: AC

## 2015-09-06 NOTE — ED Provider Notes (Signed)
I saw and evaluated the patient, reviewed the resident's note and I agree with the findings and plan.   EKG Interpretation None      Results for orders placed or performed during the hospital encounter of 09/05/15  Comprehensive metabolic panel  Result Value Ref Range   Sodium 140 135 - 145 mmol/L   Potassium 3.9 3.5 - 5.1 mmol/L   Chloride 105 101 - 111 mmol/L   CO2 21 (L) 22 - 32 mmol/L   Glucose, Bld 145 (H) 65 - 99 mg/dL   BUN 18 6 - 20 mg/dL   Creatinine, Ser 1.40 (H) 0.61 - 1.24 mg/dL   Calcium 9.5 8.9 - 10.3 mg/dL   Total Protein 7.2 6.5 - 8.1 g/dL   Albumin 3.7 3.5 - 5.0 g/dL   AST 24 15 - 41 U/L   ALT 13 (L) 17 - 63 U/L   Alkaline Phosphatase 80 38 - 126 U/L   Total Bilirubin 1.0 0.3 - 1.2 mg/dL   GFR calc non Af Amer 42 (L) >60 mL/min   GFR calc Af Amer 48 (L) >60 mL/min   Anion gap 14 5 - 15  CBC with Differential  Result Value Ref Range   WBC 15.8 (H) 4.0 - 10.5 K/uL   RBC 4.30 4.22 - 5.81 MIL/uL   Hemoglobin 12.6 (L) 13.0 - 17.0 g/dL   HCT 39.3 39.0 - 52.0 %   MCV 91.4 78.0 - 100.0 fL   MCH 29.3 26.0 - 34.0 pg   MCHC 32.1 30.0 - 36.0 g/dL   RDW 15.0 11.5 - 15.5 %   Platelets 252 150 - 400 K/uL   Neutrophils Relative % 86 %   Neutro Abs 13.5 (H) 1.7 - 7.7 K/uL   Lymphocytes Relative 8 %   Lymphs Abs 1.3 0.7 - 4.0 K/uL   Monocytes Relative 6 %   Monocytes Absolute 1.0 0.1 - 1.0 K/uL   Eosinophils Relative 0 %   Eosinophils Absolute 0.0 0.0 - 0.7 K/uL   Basophils Relative 0 %   Basophils Absolute 0.0 0.0 - 0.1 K/uL  Urinalysis, Routine w reflex microscopic (not at [facility])  Result Value Ref Range   Color, Urine YELLOW YELLOW   APPearance TURBID (A) CLEAR   Specific Gravity, Urine 1.021 1.005 - 1.030   pH 5.0 5.0 - 8.0   Glucose, UA NEGATIVE NEGATIVE mg/dL   Hgb urine dipstick MODERATE (A) NEGATIVE   Bilirubin Urine NEGATIVE NEGATIVE   Ketones, ur NEGATIVE NEGATIVE mg/dL   Protein, ur 100 (A) NEGATIVE mg/dL   Nitrite POSITIVE (A) NEGATIVE   Leukocytes, UA NEGATIVE NEGATIVE  Urine microscopic-add on  Result Value Ref Range   Squamous Epithelial / LPF 0-5 (A) NONE SEEN   WBC, UA 0-5 0 - 5 WBC/hpf   RBC / HPF 0-5 0 - 5 RBC/hpf   Bacteria, UA MANY (A) NONE SEEN   Casts GRANULAR CAST (A) NEGATIVE  I-Stat CG4 Lactic Acid, ED (Not at [facility])  Result Value Ref Range   Lactic Acid, Venous 1.63 0.5 - 2.0 mmol/L   Dg Chest 2 View  09/05/2015  CLINICAL DATA:  82 year old male with altered mental status and cough EXAM: CHEST  2 VIEW COMPARISON:  None. FINDINGS: Two views of the chest demonstrate minimal bibasilar atelectasis/ scarring. There is no focal consolidation, pleural effusion, or pneumothorax. The cardiac silhouette is within normal limits. Median sternotomy wires and CABG vascular clips noted. No acute osseous pathology. IMPRESSION: No active cardiopulmonary disease. Electronically Signed  By: Anner Crete M.D.   On: 09/05/2015 20:55   Ct Head Wo Contrast  09/05/2015  CLINICAL DATA:  Altered mental status EXAM: CT HEAD WITHOUT CONTRAST TECHNIQUE: Contiguous axial images were obtained from the base of the skull through the vertex without intravenous contrast. COMPARISON:  None FINDINGS: Prominence of the sulci and ventricles identified compatible with brain atrophy. Mild low attenuation within the subcortical and periventricular white matter is identified consistent with chronic microvascular disease. There is no abnormal extra-axial fluid collection, intracranial hemorrhage or mass noted. The paranasal sinuses and the mastoid air cells are clear. The calvarium is intact. IMPRESSION: 1. No acute intracranial abnormalities. 2. Chronic microvascular disease and brain atrophy. Electronically Signed   By: Kerby Moors M.D.   On: 09/05/2015 23:56    Patient seen by me. Patient brought in for altered mental status. Patient spoke with him yesterday and it seemed as if he was coming down with an upper respiratory infection or perhaps the flu.  Patient noted not to be acting himself today. Patient's urine was noted to have an older by the family. Family prefers for patient to be able to go home. Workup here shows obvious evidence of a nitrite positive urinary tract infection. Chest x-rays negative for pneumonia. CT head was negative. There is a leukocytosis but no anemia. Basic labs without any significant abnormality. Patient's vital signs show no evidence tachycardia no fever no hypotension. Patient's mental status has gone back to normal as per family.  Urine culture sent. Patient will receive a dose of Rocephin here and be treated with Keflex. Patient does have somewhat of a cough. We treated the with the over-the-counter cough medicine for that. Family understands to bring him back if is not improved or gets worse at all's in the next 2 days. In addition patient lactic acid is not elevated.  Fredia Sorrow, MD 09/06/15 [phone]

## 2015-09-06 NOTE — ED Notes (Signed)
Spoke with microbiology, stated flu results wont be available until morning.

## 2015-09-06 NOTE — Discharge Instructions (Signed)

## 2015-09-08 LAB — URINE CULTURE: Culture: >=100000

## 2015-09-09 ENCOUNTER — Telehealth (HOSPITAL_BASED_OUTPATIENT_CLINIC_OR_DEPARTMENT_OTHER): Payer: Self-pay | Admitting: Emergency Medicine

## 2015-09-09 NOTE — Telephone Encounter (Signed)
Post ED Visit - Positive Culture Follow-up  Culture report reviewed by antimicrobial stewardship pharmacist:  []  Elenor Quinones, Pharm.D. []  Heide Guile, Pharm.D., BCPS []  Parks Neptune, Pharm.D. []  Alycia Rossetti, Pharm.D., BCPS []  [place], Pharm.D., BCPS, AAHIVP []  Legrand Como, Pharm.D., BCPS, AAHIVP []  Milus Glazier, Pharm.D. []  Stephens November, [place].D. Angela Cox PharmD  Positive urine culture E. coli Treated with cephalexin, organism sensitive to the same and no further patient follow-up is required at this time.  Hazle Nordmann 09/09/2015, 10:21 AM

## 2015-09-10 LAB — CULTURE, BLOOD (ROUTINE X 2)
CULTURE: NO GROWTH
Culture: NO GROWTH

## 2015-10-08 ENCOUNTER — Other Ambulatory Visit: Payer: Self-pay | Admitting: *Deleted

## 2015-10-08 MED ORDER — METOPROLOL TARTRATE 25 MG PO TABS: 25.0000 mg | ORAL_TABLET | Freq: Two times a day (BID) | ORAL | Status: DC

## 2015-10-09 ENCOUNTER — Other Ambulatory Visit: Payer: Self-pay

## 2015-10-09 MED ORDER — ROSUVASTATIN CALCIUM 10 MG PO TABS: 10.0000 mg | ORAL_TABLET | Freq: Every day | ORAL | Status: DC

## 2015-10-27 ENCOUNTER — Other Ambulatory Visit: Payer: Self-pay

## 2015-10-27 MED ORDER — AMLODIPINE BESYLATE 5 MG PO TABS: 7.5000 mg | ORAL_TABLET | Freq: Every day | ORAL | Status: DC

## 2015-10-29 ENCOUNTER — Other Ambulatory Visit: Payer: Self-pay | Admitting: *Deleted

## 2015-10-29 MED ORDER — ENALAPRIL MALEATE 20 MG PO TABS: 20.0000 mg | ORAL_TABLET | Freq: Every day | ORAL | Status: DC

## 2015-10-29 NOTE — Telephone Encounter (Signed)
REFILL 

## 2015-11-10 ENCOUNTER — Ambulatory Visit: Payer: Self-pay | Admitting: Physician Assistant

## 2015-11-23 ENCOUNTER — Encounter: Payer: Self-pay | Admitting: Cardiology

## 2015-11-23 ENCOUNTER — Ambulatory Visit (INDEPENDENT_AMBULATORY_CARE_PROVIDER_SITE_OTHER): Payer: Medicare Other | Admitting: Cardiology

## 2015-11-23 VITALS — BP 110/76 | HR 72 | Ht 67.0 in | Wt 138.0 lb

## 2015-11-23 DIAGNOSIS — Z79899 Other long term (current) drug therapy: Secondary | ICD-10-CM | POA: Diagnosis not present

## 2015-11-23 DIAGNOSIS — I10 Essential (primary) hypertension: Secondary | ICD-10-CM

## 2015-11-23 DIAGNOSIS — I2510 Atherosclerotic heart disease of native coronary artery without angina pectoris: Secondary | ICD-10-CM

## 2015-11-23 NOTE — Patient Instructions (Addendum)
Medication Instructions:  Your physician recommends that you continue on your current medications as directed. Please refer to the Current Medication list given to you today.   Labwork: TODAY;  CMET 2-3 WEEKS (AT PTS CONVENIENCE) FASTING LIPID PANEL  Testing/Procedures: None ordered  Follow-Up: Your physician wants you to follow-up in: [place] will receive a reminder letter in the mail two months in advance. If you don't receive a letter, please call our office to schedule the follow-up appointment.    Any Other Special Instructions Will Be Listed Below (If Applicable).     If you need a refill on your cardiac medications before your next appointment, please call your pharmacy.

## 2015-11-23 NOTE — Progress Notes (Signed)
11/23/2015 Devin Hunter   [birth year]  [id number]  Primary Physician No PCP Per Patient Primary Cardiologist: Dr. Claiborne Billings   Reason for Visit/CC: Routine f/u for CAD.   HPI:  82 y/o AAM, followed by Dr. Claiborne Billings, who presents for routine f/u. He was last seen in clinic 05/2014. He has a h/o HTN, HLD and CAD. He underwent CABG revascularization surgery in 10-05-94 (LIMA placed to his LAD, SVG to the OM, and SVG to the PDA). Preoperative ejection fraction was 45%. His last nuclear perfusion study was in April 2011 which revealed normal perfusion. His last echo Doppler study was April 2013 showed mild concentric LVH with grade 1 diastolic dysfunction and normal LV function with ejection fraction greater than 55%. He didn't mild mitral annular calcification, moderate tricuspid regurgitation, mild aortic valve sclerosis. He did have mild pulmonary hypertension with an estimated RV systolic pressure of 40 mm. FLP 05/2014 showed LDL at 85 mg/dL.   Despite his age, he is a well appearing 82 y/o. He is independent and ambulates w/o use of an assistive device. He still performs yard work w/o limitation (cuts trees and hedges). He denies any exertional CP or dyspnea. No LEE, dizziness, syncope or near syncope. He reports full medication compliance. He lives alone but his daughter checks in regularly. His wife died in 10/05/07.  EKG today shows NSR. HR stable in the 80s. BP is well controlled at 110/76.    Current Outpatient Prescriptions  Medication Sig Dispense Refill  . amLODipine (NORVASC) 5 MG tablet Take 1.5 tablets (7.5 mg total) by mouth daily. NEED AN APPOINTMENT BEFORE NEXT REFILL. 45 tablet 1  . aspirin 81 MG tablet Take 81 mg by mouth daily.    . enalapril (VASOTEC) 20 MG tablet Take 1 tablet (20 mg total) by mouth daily. KEEP OV. 30 tablet 1  . latanoprost (XALATAN) 0.005 % ophthalmic solution Place 1 drop into both eyes at bedtime.    . metoprolol tartrate (LOPRESSOR) 25 MG tablet Take 1 tablet (25 mg  total) by mouth 2 (two) times daily. 60 tablet 1  . rosuvastatin (CRESTOR) 10 MG tablet Take 1 tablet (10 mg total) by mouth daily. NEED OV. 30 tablet 5  . vardenafil (LEVITRA) 2.5 MG tablet Take 2.5 mg by mouth daily as needed for erectile dysfunction.     No current facility-administered medications for this visit.    No Known Allergies  Social History   Social History  . Marital Status: Married    Spouse Name: N/A  . Number of Children: N/A  . Years of Education: N/A   Occupational History  . Not on file.   Social History Main Topics  . Smoking status: Never Smoker   . Smokeless tobacco: Never Used  . Alcohol Use: No  . Drug Use: Not on file  . Sexual Activity: Not on file   Other Topics Concern  . Not on file   Social History Narrative     Review of Systems: General: negative for chills, fever, night sweats or weight changes.  Cardiovascular: negative for chest pain, dyspnea on exertion, edema, orthopnea, palpitations, paroxysmal nocturnal dyspnea or shortness of breath Dermatological: negative for rash Respiratory: negative for cough or wheezing Urologic: negative for hematuria Abdominal: negative for nausea, vomiting, diarrhea, bright red blood per rectum, melena, or hematemesis Neurologic: negative for visual changes, syncope, or dizziness All other systems reviewed and are otherwise negative except as noted above.    Blood pressure 110/76, pulse 72, height 5'  7" (1.702 m), weight 138 lb (62.596 kg).  General appearance: alert, cooperative and elderly but looks good for age Neck: no carotid bruit and no JVD Lungs: clear to auscultation bilaterally Heart: regular rate and rhythm, S1, S2 normal, no murmur, click, rub or gallop Extremities: no LEE Pulses: 2+ and symmetric Skin: warm and dry Neurologic: Grossly normal  EKG NSR  ASSESSMENT AND PLAN:   1. CAD: s/p CABG surgery 21 years ago in 1996. His last nuclear perfusion study in April 2011 continued to  show normal perfusion. He denies any exertional CP or dyspnea. EKG shows NSR w/o ischemia. Continue medical therapy with ASA, statin, BB and ACE-I.   2. HLD: on Crestor for HLD. Last FLP was in 2015. Unfortunately, he did eat prior to appointment. Will place order for FLP for patient to return at a later date for fasting blood work.  We will also check hepatic function with a CMP for medication monitoring.   3. HTN: BP is well controlled. Will obtain a CMP to assess renal function given use of enalapril and advanced age.   4. ED: uses Levitra PRN.    PLAN  F/u with Dr. Claiborne Billings in 6 months.   Lyda Jester PA-C 11/23/2015 12:08 PM

## 2015-12-07 ENCOUNTER — Other Ambulatory Visit (INDEPENDENT_AMBULATORY_CARE_PROVIDER_SITE_OTHER): Payer: Medicare Other | Admitting: *Deleted

## 2015-12-07 DIAGNOSIS — I2510 Atherosclerotic heart disease of native coronary artery without angina pectoris: Secondary | ICD-10-CM | POA: Diagnosis not present

## 2015-12-07 LAB — LIPID PANEL
Cholesterol: 134 mg/dL (ref 125–200)
HDL: 44 mg/dL (ref >=40)
LDL CALC: 76 mg/dL (ref <130)
Total CHOL/HDL Ratio: 3 Ratio (ref <=5.0)
Triglycerides: 69 mg/dL (ref <150)
VLDL: 14 mg/dL (ref <30)

## 2015-12-07 NOTE — Addendum Note (Signed)
Addended by: Eulis Foster on: 12/07/2015 11:23 AM   Modules accepted: Orders

## 2015-12-31 ENCOUNTER — Other Ambulatory Visit: Payer: Self-pay

## 2015-12-31 MED ORDER — ENALAPRIL MALEATE 20 MG PO TABS: 20.0000 mg | ORAL_TABLET | Freq: Every day | ORAL | Status: DC

## 2016-02-15 ENCOUNTER — Other Ambulatory Visit: Payer: Self-pay | Admitting: Cardiovascular Disease

## 2016-02-15 NOTE — Telephone Encounter (Signed)
Rx(s) sent to pharmacy electronically.  

## 2016-02-22 ENCOUNTER — Other Ambulatory Visit: Payer: Self-pay | Admitting: Physician Assistant

## 2016-08-26 ENCOUNTER — Other Ambulatory Visit: Payer: Self-pay | Admitting: Cardiovascular Disease

## 2017-02-02 ENCOUNTER — Other Ambulatory Visit: Payer: Self-pay | Admitting: Cardiovascular Disease

## 2017-05-22 ENCOUNTER — Other Ambulatory Visit: Payer: Self-pay | Admitting: Cardiovascular Disease

## 2017-05-22 ENCOUNTER — Other Ambulatory Visit: Payer: Self-pay

## 2017-05-22 MED ORDER — ENALAPRIL MALEATE 20 MG PO TABS: 20.0000 mg | ORAL_TABLET | Freq: Every day | ORAL | 0 refills | Status: DC

## 2017-08-18 ENCOUNTER — Telehealth: Payer: Self-pay | Admitting: Cardiovascular Disease

## 2017-08-18 ENCOUNTER — Other Ambulatory Visit: Payer: Self-pay

## 2017-08-18 ENCOUNTER — Encounter (HOSPITAL_COMMUNITY): Payer: Self-pay | Admitting: *Deleted

## 2017-08-18 ENCOUNTER — Ambulatory Visit (HOSPITAL_COMMUNITY)
Admission: EM | Admit: 2017-08-18 | Discharge: 2017-08-18 | Disposition: A | Discharge status: Home / Self Care | Payer: Medicare Other | Attending: Family Medicine | Admitting: Family Medicine

## 2017-08-18 DIAGNOSIS — L03012 Cellulitis of left finger: Secondary | ICD-10-CM

## 2017-08-18 DIAGNOSIS — Z23 Encounter for immunization: Secondary | ICD-10-CM

## 2017-08-18 DIAGNOSIS — W5503XA Scratched by cat, initial encounter: Secondary | ICD-10-CM | POA: Diagnosis not present

## 2017-08-18 MED ORDER — TETANUS-DIPHTH-ACELL PERTUSSIS 5-2.5-18.5 LF-MCG/0.5 IM SUSP
INTRAMUSCULAR | Status: AC
  Filled 2017-08-18: qty 0.5

## 2017-08-18 MED ORDER — AMOXICILLIN-POT CLAVULANATE 875-125 MG PO TABS: 1.0000 | ORAL_TABLET | Freq: Two times a day (BID) | ORAL | 0 refills | Status: DC

## 2017-08-18 MED ORDER — TETANUS-DIPHTH-ACELL PERTUSSIS 5-2.5-18.5 LF-MCG/0.5 IM SUSP
0.5000 mL | Freq: Once | INTRAMUSCULAR | Status: AC
  Administered 2017-08-18: 0.5 mL via INTRAMUSCULAR

## 2017-08-18 NOTE — Telephone Encounter (Signed)
LMTCB for Devin Hunter (on [facility])  Per chart review, patient last seen by B. Rosita Fire, [place] on 11/2015 and by Dr. Claiborne Billings 05/2014

## 2017-08-18 NOTE — Discharge Instructions (Signed)
Follow up with your personal doctor regarding appetite

## 2017-08-18 NOTE — ED Provider Notes (Signed)
  [place]   [id number] 08/18/17 Arrival Time: 2671   SUBJECTIVE:  Devin Hunter is a 84 y.o. male who presents to the urgent care with complaint of cat scratch to left index finger  Also, family notes loss of appetite.  He has an appointment for this with his personal doctor.  Unusure of last dT  Past Medical History:  Diagnosis Date  . Hypertension    History reviewed. No pertinent family history. Social History   Socioeconomic History  . Marital status: Married    Spouse name: Not on file  . Number of children: Not on file  . Years of education: Not on file  . Highest education level: Not on file  Social Needs  . Financial resource strain: Not on file  . Food insecurity - worry: Not on file  . Food insecurity - inability: Not on file  . Transportation needs - medical: Not on file  . Transportation needs - non-medical: Not on file  Occupational History  . Not on file  Tobacco Use  . Smoking status: Never Smoker  . Smokeless tobacco: Never Used  Substance and Sexual Activity  . Alcohol use: No  . Drug use: Not on file  . Sexual activity: Not on file  Other Topics Concern  . Not on file  Social History Narrative  . Not on file   No outpatient medications have been marked as taking for the 08/18/17 encounter [facility] Encounter).   No Known Allergies    ROS: As per HPI, remainder of ROS negative.   OBJECTIVE:   Vitals:   08/18/17 1625  BP: 122/88  Pulse: (!) 122  Resp: 18  Temp: 98.2 F (36.8 C)  SpO2: 100%     General appearance: alert; no distress Eyes: PERRL; EOMI; conjunctiva normal HENT: normocephalic; atraumatic; TMs normal, canal normal, external ears normal without trauma; nasal mucosa normal; oral mucosa normal Neck: supple Lungs: clear to auscultation bilaterally Heart: regular rate and rhythm Abdomen: soft, non-tender; bowel sounds normal; no masses or organomegaly; no guarding or rebound tenderness Back: no CVA  tenderness Extremities: no cyanosis or edema; symmetrical with no gross deformities Skin: warm and dry Neurologic: normal gait; grossly normal Psychological: alert and cooperative; normal mood and affect      Labs:  Results for orders placed or performed in visit on 12/07/15  Lipid panel  Result Value Ref Range   Cholesterol 134 125 - 200 mg/dL   Triglycerides 69 <150 mg/dL   HDL 44 >=40 mg/dL   Total CHOL/HDL Ratio 3.0 <=5.0 Ratio   VLDL 14 <30 mg/dL   LDL Cholesterol 76 <130 mg/dL    Labs Reviewed - No data to display  No results found.     ASSESSMENT & PLAN:  1. Cellulitis of finger of left hand   2. Cat scratch     Meds ordered this encounter  Medications  . Tdap (BOOSTRIX) injection 0.5 mL  . amoxicillin-clavulanate (AUGMENTIN) 875-125 MG tablet    Sig: Take 1 tablet by mouth every 12 (twelve) hours.    Dispense:  14 tablet    Refill:  0    Reviewed expectations re: course of current medical issues. Questions answered. Outlined signs and symptoms indicating need for more acute intervention. Patient verbalized understanding. After Visit Summary given.    Procedures:      Robyn Haber, MD 08/18/17 [phone]

## 2017-08-18 NOTE — Telephone Encounter (Signed)
New message  Nevin Bloodgood verbalized that she is calling for the RN  Because pt has did not go to [place] this morning  He as not been eating like normal in the last week

## 2017-08-18 NOTE — ED Notes (Signed)
Dr. Joseph Art notified of pt HR

## 2017-08-18 NOTE — ED Triage Notes (Signed)
Pt states two days ago a stray cat scratched him on his L pointer finger. L pointer finger is red and swollen.

## 2017-08-18 NOTE — ED Triage Notes (Signed)
Pt states "I eat pretty good" but family member is concerned about his appetite is not as good as normal.

## 2017-08-18 NOTE — Telephone Encounter (Signed)
Devin Hunter, granddaughter, called in concerning patient of Dr. Claiborne Billings.   She states he seems to be doing OK, but he is not eating. He normally eats [place] daily but he did not eat this yesterday or today. He has not eaten anything today. This is very concerning as this is not his norm  He does not monitor his BP, heart rate, weight. He has no chest pain, no SOB.  Devin Hunter states he has no medications and patient told her that he is not taking pills, hasn't had any in a while.   His hands are swelling and thinks a cat bit him. Advised this should be evaluated by a PCP - Devin Hunter states they are taking him to urgent care.   Scheduled for sooner PAOV on Monday 2/18 with Devin Hunter, [place]

## 2017-08-21 ENCOUNTER — Encounter: Payer: Self-pay | Admitting: Physician Assistant

## 2017-08-21 ENCOUNTER — Ambulatory Visit: Payer: Medicare Other | Admitting: Physician Assistant

## 2017-08-21 VITALS — BP 122/78 | HR 83 | Ht 68.0 in | Wt 129.6 lb

## 2017-08-21 DIAGNOSIS — E785 Hyperlipidemia, unspecified: Secondary | ICD-10-CM

## 2017-08-21 DIAGNOSIS — I10 Essential (primary) hypertension: Secondary | ICD-10-CM

## 2017-08-21 DIAGNOSIS — I2510 Atherosclerotic heart disease of native coronary artery without angina pectoris: Secondary | ICD-10-CM | POA: Diagnosis not present

## 2017-08-21 LAB — LIPID PANEL
CHOL/HDL RATIO: 4.1 ratio (ref 0.0–5.0)
CHOLESTEROL TOTAL: 138 mg/dL (ref 100–199)
HDL: 34 mg/dL — ABNORMAL LOW (ref >39)
LDL CALC: 89 mg/dL (ref 0–99)
TRIGLYCERIDES: 76 mg/dL (ref 0–149)
VLDL Cholesterol Cal: 15 mg/dL (ref 5–40)

## 2017-08-21 LAB — COMPREHENSIVE METABOLIC PANEL
ALBUMIN: 4 g/dL (ref 3.2–4.6)
ALK PHOS: 87 IU/L (ref 39–117)
ALT: 8 IU/L (ref 0–44)
AST: 13 IU/L (ref 0–40)
Albumin/Globulin Ratio: 1.4 (ref 1.2–2.2)
BILIRUBIN TOTAL: 0.3 mg/dL (ref 0.0–1.2)
BUN / CREAT RATIO: 9 — AB (ref 10–24)
BUN: 10 mg/dL (ref 10–36)
CHLORIDE: 103 mmol/L (ref 96–106)
CO2: 22 mmol/L (ref 20–29)
Calcium: 9.9 mg/dL (ref 8.6–10.2)
Creatinine, Ser: 1.15 mg/dL (ref 0.76–1.27)
GFR calc Af Amer: 62 mL/min/{1.73_m2} (ref >59)
GFR calc non Af Amer: 54 mL/min/{1.73_m2} — ABNORMAL LOW (ref >59)
GLOBULIN, TOTAL: 2.9 g/dL (ref 1.5–4.5)
Glucose: 102 mg/dL — ABNORMAL HIGH (ref 65–99)
Potassium: 4.3 mmol/L (ref 3.5–5.2)
SODIUM: 141 mmol/L (ref 134–144)
Total Protein: 6.9 g/dL (ref 6.0–8.5)

## 2017-08-21 LAB — CBC
HEMATOCRIT: 39 % (ref 37.5–51.0)
Hemoglobin: 13 g/dL (ref 13.0–17.7)
MCH: 31 pg (ref 26.6–33.0)
MCHC: 33.3 g/dL (ref 31.5–35.7)
MCV: 93 fL (ref 79–97)
Platelets: 317 10*3/uL (ref 150–379)
RBC: 4.2 x10E6/uL (ref 4.14–5.80)
RDW: 13.8 % (ref 12.3–15.4)
WBC: 10.2 10*3/uL (ref 3.4–10.8)

## 2017-08-21 MED ORDER — CARVEDILOL 3.125 MG PO TABS: 3.1250 mg | ORAL_TABLET | Freq: Two times a day (BID) | ORAL | 3 refills | Status: DC

## 2017-08-21 NOTE — Progress Notes (Signed)
Cardiology Office Note   Date:  08/21/2017   ID:  Devin Hunter, DOB 09/18/1919, MRN [id number]  PCP:  Patient, No Pcp Per  Cardiologist: Dr. Claiborne Billings, 05/15/2014 Ellen Henri PA-C, 11/23/2015 Rosaria Ferries, PA-C    Chief Complaint  Patient presents with  . Follow-up    History of Present Illness: Devin Hunter is a 84 y.o. male with a history of HTN, HLD and CABG 1996 (LIMA-LAD, SVG-OM, and SVG-PDA).  08/18/2017 ER visit for cat scratch, patient given antibiotics and a tetanus booster 08/18/2017 phone notes regarding patient not being on any of his medications and swelling, appointment made  Devin Hunter presents for cardiology follow up. His granddaughter is with him.  His granddaughter is worried that his activity level has decreased. He states he is still using the chain saw and climbing trees, but that is not true. He has not been going to [place] as usual for breakfast.  The family became concerned when he quit doing his usual routine and was staying at home more.  He denies any kind of pain. He has been keeping his yard up during the summer, but has not been doing anything this winter.  He has not been shoveling snow or cleaning up the yard after storms.  He gets stiff from sitting.  He does not move around as much as he used to.  He denies any chest pain with exertion.  He denies LE edema, orthopnea or PND.  No palpitations, no presyncope or syncope.   Blind in L eye, still drives.   Granddaughter reports that they found multiple pill bottles, some of them legible, some of them not.  Some of them have pills in them.  She does not know what he is taking.  Pharmacies were contacted that he had used previously and he has had nothing filled in over a year except the antibiotics he got from the emergency room.  His granddaughter did get him a bottle of baby aspirin and he has been taking those.   Past Medical History:  Diagnosis Date  . CAD (coronary  artery disease) 1996   CABG w/ LIMA-LAD, SVG-OM, and SVG-PDA  . Hyperlipidemia LDL goal <70   . Hypertension     Past Surgical History:  Procedure Laterality Date  . CORONARY ARTERY BYPASS GRAFT  1996   LIMA-LAD, SVG-OM, and SVG-PDA    Current Outpatient Medications  Medication Sig Dispense Refill  . amoxicillin-clavulanate (AUGMENTIN) 875-125 MG tablet Take 1 tablet by mouth every 12 (twelve) hours. 14 tablet 0  . amLODipine (NORVASC) 5 MG tablet Take 1.5 tablets (7.5 mg total) by mouth daily. NEED AN APPOINTMENT BEFORE NEXT REFILL. 45 tablet 1  . aspirin 81 MG tablet Take 81 mg by mouth daily.    . enalapril (VASOTEC) 20 MG tablet TAKE 1 TABLET BY MOUTH DAILY 90 tablet 0  . latanoprost (XALATAN) 0.005 % ophthalmic solution Place 1 drop into both eyes at bedtime.    . metoprolol tartrate (LOPRESSOR) 25 MG tablet TAKE 1 TABLET BY MOUTH TWICE DAILY 60 tablet 0  . rosuvastatin (CRESTOR) 10 MG tablet take 1 tablet by mouth once daily 30 tablet 0  . vardenafil (LEVITRA) 2.5 MG tablet Take 2.5 mg by mouth daily as needed for erectile dysfunction.     No current facility-administered medications for this visit.     Allergies:   Patient has no known allergies.    Social History:  The patient  reports that  has  never smoked. he has never used smokeless tobacco. He reports that he does not drink alcohol.   Family History:  The patient's family history is not on file.    ROS:  Please see the history of present illness. All other systems are reviewed and negative.    PHYSICAL EXAM: VS:  BP 122/78   Pulse 83   Ht 5\' 8"  (1.727 m)   Wt 129 lb 9.6 oz (58.8 kg)   BMI 19.71 kg/m  , BMI Body mass index is 19.71 kg/m. GEN: Well nourished, frail, elderly , male in no acute distress  HEENT: normal for age  Neck: no JVD, no carotid bruit, no masses Cardiac: RRR; 2/6 murmur, no rubs, or gallops Respiratory: decreased BS bases bilaterally, normal work of breathing GI: soft, nontender,  nondistended, + BS MS: no deformity or atrophy; no edema; distal pulses are 2+ in all 4 extremities   Skin: warm and dry, no rash Neuro:  Strength and sensation are intact Psych: euthymic mood, full affect   EKG:  EKG is ordered today. The ekg ordered today demonstrates SR, PACs, HR 83. No Q waves normal intervals.   Recent Labs: No results found for requested labs within last 8760 hours.    Lipid Panel    Component Value Date/Time   CHOL 134 12/07/2015 1128   TRIG 69 12/07/2015 1128   HDL 44 12/07/2015 1128   CHOLHDL 3.0 12/07/2015 1128   VLDL 14 12/07/2015 1128   LDLCALC 76 12/07/2015 1128     Wt Readings from Last 3 Encounters:  08/21/17 129 lb 9.6 oz (58.8 kg)  11/23/15 138 lb (62.6 kg)  09/06/15 140 lb (63.5 kg)     Other studies Reviewed: Additional studies/ records that were reviewed today include: office notes, hospital records and testing .  ASSESSMENT AND PLAN:  1.  CAD: No ischemic sx, continue aspirin.  Add low-dose beta-blocker as his blood pressure is essentially controlled.  Add statin.  Check an echocardiogram because of his history of CAD and systolic murmur, but do not anticipate any ischemic testing.  Of note, he ambulated in the office greater than 200 feet and his oxygen saturation remained greater than 91% and he denies shortness of breath with this.  2.  Hypertension: His blood pressure is actually at goal, but I believe he will tolerate a low-dose beta-blocker.  3.  Hyperlipidemia: Goal LDL is less than 70.  His bypass grafts are 82 years old, so I wish to keep his LDL at goal.  Check a lipid profile today and add low-dose statin.  4.  General medical care: He has no recent PCP visits.  We contacted the [facility] [place] office and were able to get him an appointment with the nurse practitioner there.   Current medicines are reviewed at length with the patient today.  The patient does not have concerns regarding medicines.  The following changes have  been made: Add beta-blocker and statin  Labs/ tests ordered today include:   Orders Placed This Encounter  Procedures  . Comprehensive metabolic panel  . CBC  . Lipid panel  . EKG 12-Lead  . ECHOCARDIOGRAM COMPLETE     Disposition:   FU with Dr. Claiborne Billings and Dorothyann Peng, NP with Primary Care  Signed, Rosaria Ferries, PA-C  08/21/2017 10:42 AM    [place] Phone: [phone]; Fax: [phone]  This note was written with the assistance of speech recognition software. Please excuse any transcriptional errors.

## 2017-08-21 NOTE — Patient Instructions (Signed)
Medication Instructions: START Carvedilol 3.125 mg tablet twice daily.  If you need a refill on your cardiac medications before your next appointment, please call your pharmacy.   Labwork: Your provider would like for you to have the following labs today: CMET, Fasting Lipid, and a CBC   Procedures/Testing: Your physician has requested that you have an echocardiogram. Echocardiography is a painless test that uses sound waves to create images of your heart. It provides your doctor with information about the size and shape of your heart and how well your heart's chambers and valves are working. This procedure takes approximately one hour. There are no restrictions for this procedure. This will take place at [address], [address].   Follow-Up: Your physician wants you to keep your follow up with Dr. Claiborne Billings on 09/01/17  Special Instructions:  An Appointment has been made for you on 09/07/2017 at 10:30 am with Sallee Provencal, NP.  Address- [place], [place]  [phone]  Thank you for choosing [facility] at [facility]!!

## 2017-08-22 ENCOUNTER — Telehealth (HOSPITAL_COMMUNITY): Payer: Self-pay | Admitting: Family Medicine

## 2017-08-22 MED ORDER — AMOXICILLIN-POT CLAVULANATE 875-125 MG PO TABS: 1.0000 | ORAL_TABLET | Freq: Two times a day (BID) | ORAL | 0 refills | Status: DC

## 2017-08-25 ENCOUNTER — Other Ambulatory Visit: Payer: Self-pay

## 2017-08-25 ENCOUNTER — Ambulatory Visit (HOSPITAL_COMMUNITY): Payer: Medicare Other | Attending: Cardiovascular Disease

## 2017-08-25 DIAGNOSIS — I420 Dilated cardiomyopathy: Secondary | ICD-10-CM | POA: Insufficient documentation

## 2017-08-25 DIAGNOSIS — I082 Rheumatic disorders of both aortic and tricuspid valves: Secondary | ICD-10-CM | POA: Insufficient documentation

## 2017-08-25 DIAGNOSIS — I2510 Atherosclerotic heart disease of native coronary artery without angina pectoris: Secondary | ICD-10-CM

## 2017-08-25 DIAGNOSIS — I5030 Unspecified diastolic (congestive) heart failure: Secondary | ICD-10-CM | POA: Insufficient documentation

## 2017-08-25 DIAGNOSIS — I10 Essential (primary) hypertension: Secondary | ICD-10-CM

## 2017-09-01 ENCOUNTER — Other Ambulatory Visit: Payer: Self-pay

## 2017-09-01 ENCOUNTER — Ambulatory Visit: Payer: Self-pay | Admitting: Cardiovascular Disease

## 2017-09-01 MED ORDER — POTASSIUM CHLORIDE ER 10 MEQ PO TBCR: 10.0000 meq | EXTENDED_RELEASE_TABLET | ORAL | 3 refills | Status: DC

## 2017-09-01 MED ORDER — FUROSEMIDE 20 MG PO TABS: 20.0000 mg | ORAL_TABLET | ORAL | 3 refills | Status: DC

## 2017-09-06 ENCOUNTER — Telehealth: Payer: Self-pay | Admitting: Adult Health

## 2017-09-06 NOTE — Telephone Encounter (Signed)
[name] will be out of the office 09/07/17-09/08/17  LMVM on mobile number for the patient to call back and reschedule  The home number is not available to leave a message

## 2017-09-07 ENCOUNTER — Ambulatory Visit: Payer: Self-pay | Admitting: Adult Health

## 2017-09-19 ENCOUNTER — Other Ambulatory Visit: Payer: Self-pay

## 2017-09-19 DIAGNOSIS — I2510 Atherosclerotic heart disease of native coronary artery without angina pectoris: Secondary | ICD-10-CM

## 2017-09-22 ENCOUNTER — Ambulatory Visit (INDEPENDENT_AMBULATORY_CARE_PROVIDER_SITE_OTHER): Payer: Medicare Other | Admitting: Adult Health

## 2017-09-22 ENCOUNTER — Encounter: Payer: Self-pay | Admitting: Adult Health

## 2017-09-22 VITALS — BP 120/70 | Temp 97.5°F | Wt 127.0 lb

## 2017-09-22 DIAGNOSIS — Z7689 Persons encountering health services in other specified circumstances: Secondary | ICD-10-CM

## 2017-09-22 DIAGNOSIS — Z23 Encounter for immunization: Secondary | ICD-10-CM

## 2017-09-22 DIAGNOSIS — E785 Hyperlipidemia, unspecified: Secondary | ICD-10-CM

## 2017-09-22 DIAGNOSIS — I10 Essential (primary) hypertension: Secondary | ICD-10-CM | POA: Diagnosis not present

## 2017-09-22 NOTE — Progress Notes (Signed)
Patient presents to clinic today to establish care. He is a pleasant 84 year old male who  has a past medical history of CAD (coronary artery disease) (1996), Hyperlipidemia LDL goal <70, and Hypertension. His granddaughter is with him today to help provide history   He continues to live independently and continues to drive  Acute Concerns: Establish Care   Chronic Issues: Essential Hypertension - He is seen by Cardiology. Was last seen 08/21/2017. At this time was placed on low dose Coreg 3.25 mg   Hyperlipidemia - Had bypass grafts >20 years go. Not currently on any statins   Lab Results  Component Value Date   CHOL 138 08/21/2017   HDL 34 (L) 08/21/2017   LDLCALC 89 08/21/2017   TRIG 76 08/21/2017   CHOLHDL 4.1 08/21/2017      His granddaughter reorts that she is worried they his activity level has been decreasing. She reports that he is not going to [place] in the morning for breakfast, something that he used to do on a regular basis. He is also staying at home more often. Patient reports that he is still using a chainsaw and is climbing trees, his granddaughter reports that this is not true.   Health Maintenance: Dental -- Does not see on a routine basis Vision -- Sees Dr. Schuyler Amor - last eye exam in 07/2017  Immunizations -- Due for Pneumonia vaccination.   Past Medical History:  Diagnosis Date  . CAD (coronary artery disease) 1996   CABG w/ LIMA-LAD, SVG-OM, and SVG-PDA  . Hyperlipidemia LDL goal <70   . Hypertension     Past Surgical History:  Procedure Laterality Date  . CORONARY ARTERY BYPASS GRAFT  1996   LIMA-LAD, SVG-OM, and SVG-PDA    Current Outpatient Medications on File Prior to Visit  Medication Sig Dispense Refill  . aspirin 81 MG tablet Take 81 mg by mouth daily.    . carvedilol (COREG) 3.125 MG tablet Take 1 tablet (3.125 mg total) by mouth 2 (two) times daily. 180 tablet 3  . furosemide (LASIX) 20 MG tablet Take 1 tablet (20 mg total) by  mouth every other day. 30 tablet 3  . potassium chloride (K-DUR) 10 MEQ tablet Take 1 tablet (10 mEq total) by mouth every other day. Take on same days as Lasix 30 tablet 3   No current facility-administered medications on file prior to visit.     No Known Allergies  History reviewed. No pertinent family history.  Social History   Socioeconomic History  . Marital status: Married    Spouse name: Not on file  . Number of children: Not on file  . Years of education: Not on file  . Highest education level: Not on file  Occupational History  . Not on file  Social Needs  . Financial resource strain: Not on file  . Food insecurity:    Worry: Not on file    Inability: Not on file  . Transportation needs:    Medical: Not on file    Non-medical: Not on file  Tobacco Use  . Smoking status: Never Smoker  . Smokeless tobacco: Never Used  Substance and Sexual Activity  . Alcohol use: No  . Drug use: Not on file  . Sexual activity: Not on file  Lifestyle  . Physical activity:    Days per week: Not on file    Minutes per session: Not on file  . Stress: Not on file  Relationships  . Social  connections:    Talks on phone: Not on file    Gets together: Not on file    Attends religious service: Not on file    Active member of club or organization: Not on file    Attends meetings of clubs or organizations: Not on file    Relationship status: Not on file  . Intimate partner violence:    Fear of current or ex partner: Not on file    Emotionally abused: Not on file    Physically abused: Not on file    Forced sexual activity: Not on file  Other Topics Concern  . Not on file  Social History Narrative  . Not on file    Review of Systems  Constitutional: Negative.   HENT: Positive for hearing loss.   Eyes: Negative.   Respiratory: Negative.   Cardiovascular: Negative.   Gastrointestinal: Negative.   Genitourinary: Negative.   Musculoskeletal: Positive for myalgias.  Skin:  Negative.   Neurological: Negative.   Endo/Heme/Allergies: Negative.   Psychiatric/Behavioral: Negative.   All other systems reviewed and are negative.   BP 120/70 (BP Location: Right Arm)   Temp (!) 97.5 F (36.4 C) (Oral)   Wt 127 lb (57.6 kg)   BMI 19.31 kg/m   Physical Exam  Constitutional: He is oriented to person, place, and time and well-developed, well-nourished, and in no distress. No distress.  HENT:  Head: Normocephalic and atraumatic.  Right Ear: External ear normal.  Left Ear: External ear normal.  Nose: Nose normal.  Mouth/Throat: Oropharynx is clear and moist. No oropharyngeal exudate.  Cardiovascular: Normal rate, regular rhythm and intact distal pulses. Exam reveals no gallop and no friction rub.  Murmur heard. Pulmonary/Chest: Effort normal and breath sounds normal. No respiratory distress. He has no wheezes. He has no rales. He exhibits no tenderness.  Neurological: He is alert and oriented to person, place, and time. Gait normal. GCS score is 15.  Skin: Skin is warm and dry. No rash noted. He is not diaphoretic. No erythema. No pallor.  Psychiatric: Mood, affect and judgment normal.  Nursing note and vitals reviewed.   Recent Results (from the past 2160 hour(s))  Comprehensive metabolic panel     Status: Abnormal   Collection Time: 08/21/17 10:15 AM  Result Value Ref Range   Glucose 102 (H) 65 - 99 mg/dL   BUN 10 10 - 36 mg/dL   Creatinine, Ser 1.15 0.76 - 1.27 mg/dL   GFR calc non Af Amer 54 (L) >59 mL/min/1.73   GFR calc Af Amer 62 >59 mL/min/1.73   BUN/Creatinine Ratio 9 (L) 10 - 24   Sodium 141 134 - 144 mmol/L   Potassium 4.3 3.5 - 5.2 mmol/L   Chloride 103 96 - 106 mmol/L   CO2 22 20 - 29 mmol/L   Calcium 9.9 8.6 - 10.2 mg/dL   Total Protein 6.9 6.0 - 8.5 g/dL   Albumin 4.0 3.2 - 4.6 g/dL   Globulin, Total 2.9 1.5 - 4.5 g/dL   Albumin/Globulin Ratio 1.4 1.2 - 2.2   Bilirubin Total 0.3 0.0 - 1.2 mg/dL   Alkaline Phosphatase 87 39 - 117 IU/L     AST 13 0 - 40 IU/L   ALT 8 0 - 44 IU/L  CBC     Status: None   Collection Time: 08/21/17 10:15 AM  Result Value Ref Range   WBC 10.2 3.4 - 10.8 x10E3/uL   RBC 4.20 4.14 - 5.80 x10E6/uL   Hemoglobin 13.0 13.0 -  17.7 g/dL   Hematocrit 39.0 37.5 - 51.0 %   MCV 93 79 - 97 fL   MCH 31.0 26.6 - 33.0 pg   MCHC 33.3 31.5 - 35.7 g/dL   RDW 13.8 12.3 - 15.4 %   Platelets 317 150 - 379 x10E3/uL  Lipid panel     Status: Abnormal   Collection Time: 08/21/17 10:15 AM  Result Value Ref Range   Cholesterol, Total 138 100 - 199 mg/dL   Triglycerides 76 0 - 149 mg/dL   HDL 34 (L) >39 mg/dL   VLDL Cholesterol Cal 15 5 - 40 mg/dL   LDL Calculated 89 0 - 99 mg/dL   Chol/HDL Ratio 4.1 0.0 - 5.0 ratio    Comment:                                   T. Chol/HDL Ratio                                             Men  Women                               1/2 Avg.Risk  3.4    3.3                                   Avg.Risk  5.0    4.4                                2X Avg.Risk  9.6    7.1                                3X Avg.Risk 23.4   11.0     Assessment/Plan: 1. Encounter to establish care - Advised patient and family to follow up as needed. No blood work needed today as he had some done by Cardiology last month  - Appears to be some cognitive issues. We will keep an eye on this.   2. Essential hypertension - Well controlled. No change in medication   3. Need for vaccination with 13-polyvalent pneumococcal conjugate vaccine - Pneumococcal conjugate vaccine 13-valent  4. Hyperlipidemia, unspecified hyperlipidemia type - At goal.  - Follow up as needed

## 2017-09-22 NOTE — Patient Instructions (Signed)
It was great meeting you today   Please take your medications and follow up with cardiology as directed   You can see me as needed

## 2017-10-06 ENCOUNTER — Ambulatory Visit: Payer: Medicare Other | Admitting: Cardiovascular Disease

## 2018-05-19 ENCOUNTER — Other Ambulatory Visit: Payer: Self-pay | Admitting: Physician Assistant

## 2018-08-19 ENCOUNTER — Other Ambulatory Visit: Payer: Self-pay | Admitting: Physician Assistant

## 2018-09-26 ENCOUNTER — Telehealth: Payer: Self-pay | Admitting: Adult Health

## 2018-09-26 NOTE — Telephone Encounter (Signed)
LMVM to reschedule physical appointment because of the COVID-19

## 2018-09-28 ENCOUNTER — Encounter: Payer: Medicare Other | Admitting: Adult Health

## 2018-11-20 ENCOUNTER — Other Ambulatory Visit: Payer: Self-pay | Admitting: Physician Assistant

## 2018-11-28 ENCOUNTER — Other Ambulatory Visit: Payer: Self-pay

## 2018-11-28 MED ORDER — CARVEDILOL 3.125 MG PO TABS: 3.1250 mg | ORAL_TABLET | Freq: Two times a day (BID) | ORAL | 3 refills | Status: DC

## 2018-12-27 ENCOUNTER — Encounter: Payer: Medicare Other | Admitting: Adult Health

## 2019-03-15 ENCOUNTER — Other Ambulatory Visit: Payer: Self-pay | Admitting: Physician Assistant

## 2019-03-15 NOTE — Telephone Encounter (Signed)
This is Dr. Kelly's pt. °

## 2019-06-05 ENCOUNTER — Other Ambulatory Visit: Payer: Self-pay | Admitting: Physician Assistant

## 2019-06-05 NOTE — Telephone Encounter (Signed)
This is Dr. Kelly's pt. °

## 2019-06-06 ENCOUNTER — Other Ambulatory Visit: Payer: Self-pay | Admitting: Physician Assistant

## 2019-06-06 NOTE — Telephone Encounter (Signed)
This is Dr. Kelly's pt. °

## 2019-10-04 ENCOUNTER — Other Ambulatory Visit: Payer: Self-pay

## 2019-10-04 MED ORDER — POTASSIUM CHLORIDE ER 10 MEQ PO TBCR: EXTENDED_RELEASE_TABLET | ORAL | 3 refills | Status: DC

## 2020-01-12 ENCOUNTER — Other Ambulatory Visit: Payer: Self-pay | Admitting: Physician Assistant

## 2020-03-07 ENCOUNTER — Other Ambulatory Visit: Payer: Self-pay | Admitting: Physician Assistant

## 2020-03-10 NOTE — Telephone Encounter (Signed)
This is Dr. Kelly's pt. °

## 2020-11-03 ENCOUNTER — Other Ambulatory Visit: Payer: Self-pay | Admitting: Cardiovascular Disease

## 2020-11-03 ENCOUNTER — Other Ambulatory Visit: Payer: Self-pay | Admitting: Physician Assistant

## 2020-12-04 ENCOUNTER — Other Ambulatory Visit: Payer: Self-pay | Admitting: Cardiovascular Disease

## 2020-12-22 ENCOUNTER — Encounter (HOSPITAL_COMMUNITY): Payer: Self-pay

## 2020-12-22 ENCOUNTER — Other Ambulatory Visit: Payer: Self-pay

## 2020-12-22 ENCOUNTER — Ambulatory Visit (INDEPENDENT_AMBULATORY_CARE_PROVIDER_SITE_OTHER): Payer: Medicare Other

## 2020-12-22 ENCOUNTER — Telehealth: Payer: Medicare Other | Admitting: Family Medicine

## 2020-12-22 ENCOUNTER — Ambulatory Visit (HOSPITAL_COMMUNITY)
Admission: EM | Admit: 2020-12-22 | Discharge: 2020-12-22 | Disposition: A | Discharge status: Home / Self Care | Payer: Medicare Other | Attending: Internal Medicine | Admitting: Internal Medicine

## 2020-12-22 ENCOUNTER — Emergency Department (HOSPITAL_COMMUNITY)
Admission: EM | Admit: 2020-12-22 | Discharge: 2020-12-22 | Disposition: A | Discharge status: Home / Self Care | Payer: Medicare Other | Attending: Emergency Medicine | Admitting: Emergency Medicine

## 2020-12-22 DIAGNOSIS — Z7982 Long term (current) use of aspirin: Secondary | ICD-10-CM | POA: Insufficient documentation

## 2020-12-22 DIAGNOSIS — Z951 Presence of aortocoronary bypass graft: Secondary | ICD-10-CM | POA: Diagnosis not present

## 2020-12-22 DIAGNOSIS — R062 Wheezing: Secondary | ICD-10-CM | POA: Insufficient documentation

## 2020-12-22 DIAGNOSIS — R0602 Shortness of breath: Secondary | ICD-10-CM

## 2020-12-22 DIAGNOSIS — Z79899 Other long term (current) drug therapy: Secondary | ICD-10-CM | POA: Diagnosis not present

## 2020-12-22 DIAGNOSIS — I10 Essential (primary) hypertension: Secondary | ICD-10-CM | POA: Insufficient documentation

## 2020-12-22 DIAGNOSIS — R059 Cough, unspecified: Secondary | ICD-10-CM | POA: Insufficient documentation

## 2020-12-22 DIAGNOSIS — I2510 Atherosclerotic heart disease of native coronary artery without angina pectoris: Secondary | ICD-10-CM | POA: Insufficient documentation

## 2020-12-22 LAB — CBC
HCT: 41.8 % (ref 39.0–52.0)
Hemoglobin: 13.2 g/dL (ref 13.0–17.0)
MCH: 31.4 pg (ref 26.0–34.0)
MCHC: 31.6 g/dL (ref 30.0–36.0)
MCV: 99.5 fL (ref 80.0–100.0)
Platelets: 221 10*3/uL (ref 150–400)
RBC: 4.2 MIL/uL — ABNORMAL LOW (ref 4.22–5.81)
RDW: 13.7 % (ref 11.5–15.5)
WBC: 13.7 10*3/uL — ABNORMAL HIGH (ref 4.0–10.5)
nRBC: 0 % (ref 0.0–0.2)

## 2020-12-22 LAB — BASIC METABOLIC PANEL
Anion gap: 8 (ref 5–15)
BUN: 14 mg/dL (ref 8–23)
CO2: 28 mmol/L (ref 22–32)
Calcium: 9.3 mg/dL (ref 8.9–10.3)
Chloride: 104 mmol/L (ref 98–111)
Creatinine, Ser: 1.39 mg/dL — ABNORMAL HIGH (ref 0.61–1.24)
GFR, Estimated: 46 mL/min — ABNORMAL LOW (ref >60)
Glucose, Bld: 103 mg/dL — ABNORMAL HIGH (ref 70–99)
Potassium: 4 mmol/L (ref 3.5–5.1)
Sodium: 140 mmol/L (ref 135–145)

## 2020-12-22 LAB — BRAIN NATRIURETIC PEPTIDE: B Natriuretic Peptide: 296.3 pg/mL — ABNORMAL HIGH (ref 0.0–100.0)

## 2020-12-22 MED ORDER — ALBUTEROL SULFATE HFA 108 (90 BASE) MCG/ACT IN AERS
1.0000 | INHALATION_SPRAY | Freq: Once | RESPIRATORY_TRACT | Status: AC
  Administered 2020-12-22: 1 via RESPIRATORY_TRACT
  Filled 2020-12-22: qty 6.7

## 2020-12-22 NOTE — ED Provider Notes (Signed)
Emergency Medicine Provider Triage Evaluation Note  Devin Hunter , a 87 y.o. male  was evaluated in triage.  Pt complains of 4 days of dry cough, wheezing, sore throat, shortness of breath, and pedal edema.  His daughter is at the bedside.  He is hard of hearing.  History of coronary artery disease status post CABG with LVEF of 50 to 50% and 2021. He is not anticoagulated.  Patient states that he does not have any symptoms at this time and does not know why he is here.  Review of Systems  Positive: Cough, shortness of breath, sore throat, bilateral lower extremity edema. Negative: Chest pain, palpitations, nausea, vomiting, diarrhea, fevers, chills  Physical Exam  BP (!) 144/93 (BP Location: Left Arm)   Pulse 88   Temp 97.8 F (36.6 C) (Oral)   Resp 13   SpO2 96%  Gen:   Awake, no distress   Resp:  Normal effort  MSK:   Moves extremities without difficulty  Other:  RRR, no M/R/G.  Lungs with wheezing at the lung fields bilaterally decreased breath sounds at lung bases.  No lower extremity on exam.  Medical Decision Making  Medically screening exam initiated at 1:36 PM.  Appropriate orders placed.  [name] was informed that the remainder of the evaluation will be completed by another provider, this initial triage assessment does not replace that evaluation, and the importance of remaining in the ED until their evaluation is complete.  This chart was dictated using voice recognition software, Dragon. Despite the best efforts of this provider to proofread and correct errors, errors may still occur which can change documentation meaning.    [name] 12/22/20 1338    Noemi Chapel, MD 12/23/20 1034

## 2020-12-22 NOTE — ED Provider Notes (Signed)
[facility] EMERGENCY DEPARTMENT Provider Note   CSN: [id number] Arrival date & time: 12/22/20  1306     History Chief Complaint  Patient presents with   Cough   Shortness of Breath    Devin Hunter is a 87 y.o. male with a past medical history of hypertension, CAD presenting to the ED with a chief complaint of wheezing and cough.  History is provided by daughter at the bedside as patient states that he feels okay.  He states that "I am 87 years old, I feel fine that anyone have a headache or anything like that."  She noticed for the past 4 days he has had a dry cough and feeling like he is wheezing.  He denies any chest pain, fever, vomiting, diarrhea, abdominal pain, injuries or falls.  No chronic lung disease or tobacco use.   HPI     Past Medical History:  Diagnosis Date   CAD (coronary artery disease) 1996   CABG w/ LIMA-LAD, SVG-OM, and SVG-PDA   Hyperlipidemia LDL goal <70    Hypertension     Patient Active Problem List   Diagnosis Date Noted   Erectile dysfunction 05/15/2014   CAD (coronary artery disease) 06/02/2013   HTN (hypertension) 06/02/2013   Hyperlipidemia with target LDL less than 70 06/02/2013   PSA elevation 06/02/2013    Past Surgical History:  Procedure Laterality Date   CORONARY ARTERY BYPASS GRAFT  1996   LIMA-LAD, SVG-OM, and SVG-PDA       No family history on file.  Social History   Tobacco Use   Smoking status: Never   Smokeless tobacco: Never  Substance Use Topics   Alcohol use: No   Drug use: Not Currently    Home Medications Prior to Admission medications   Medication Sig Start Date End Date Taking? Authorizing Provider  aspirin 81 MG tablet Take 81 mg by mouth daily.    [provider]  carvedilol (COREG) 3.125 MG tablet TAKE 1 TABLET(3.125 MG) BY MOUTH TWICE DAILY 01/14/20   Barrett, Evelene Croon, PA-C  furosemide (LASIX) 20 MG tablet Take 1 tablet (20 mg total) by mouth every other day. PATIENT  MUST SCHEDULE APPOINTMENT FOR FUTURE REFILLS. **2ND ATTEMPT** 12/04/20   Troy Sine, MD  potassium chloride (KLOR-CON) 10 MEQ tablet TAKE 1 TABLET BY MOUTH EVERY OTHER DAY, TAKE WITH LASIX 11/03/20   Troy Sine, MD    Allergies    Patient has no known allergies.  Review of Systems   Review of Systems  Constitutional:  Negative for appetite change, chills and fever.  Eyes:  Negative for photophobia and visual disturbance.  Cardiovascular:  Negative for chest pain and palpitations.  Gastrointestinal:  Negative for abdominal pain, blood in stool, constipation, diarrhea, nausea and vomiting.  Genitourinary:  Negative for dysuria, hematuria and urgency.  Musculoskeletal:  Negative for myalgias.  Skin:  Negative for rash.  Neurological:  Negative for dizziness, weakness and light-headedness.   Physical Exam Updated Vital Signs BP 137/75   Pulse 76   Temp 97.8 F (36.6 C) (Oral)   Resp (!) 21   SpO2 100%   Physical Exam Vitals and nursing note reviewed.  Constitutional:      General: He is not in acute distress.    Appearance: He is well-developed.  HENT:     Head: Normocephalic and atraumatic.     Nose: Nose normal.  Eyes:     General: No scleral icterus.  Left eye: No discharge.     Conjunctiva/sclera: Conjunctivae normal.  Cardiovascular:     Rate and Rhythm: Normal rate and regular rhythm.     Heart sounds: Normal heart sounds. No murmur heard.   No friction rub. No gallop.  Pulmonary:     Effort: Pulmonary effort is normal. No respiratory distress.     Breath sounds: Examination of the right-lower field reveals wheezing. Examination of the left-lower field reveals wheezing. Wheezing present.  Abdominal:     General: Bowel sounds are normal. There is no distension.     Palpations: Abdomen is soft.     Tenderness: There is no abdominal tenderness. There is no guarding.  Musculoskeletal:        General: Normal range of motion.     Cervical back: Normal range  of motion and neck supple.     Right lower leg: No edema.     Left lower leg: No edema.  Skin:    General: Skin is warm and dry.     Findings: No rash.  Neurological:     Mental Status: He is alert.     Motor: No abnormal muscle tone.     Coordination: Coordination normal.    ED Results / Procedures / Treatments   Labs (all labs ordered are listed, but only abnormal results are displayed) Labs Reviewed  BASIC METABOLIC PANEL - Abnormal; Notable for the following components:      Result Value   Glucose, Bld 103 (*)    Creatinine, Ser 1.39 (*)    GFR, Estimated 46 (*)    All other components within normal limits  CBC - Abnormal; Notable for the following components:   WBC 13.7 (*)    RBC 4.20 (*)    All other components within normal limits  BRAIN NATRIURETIC PEPTIDE - Abnormal; Notable for the following components:   B Natriuretic Peptide 296.3 (*)    All other components within normal limits    EKG EKG Interpretation  Date/Time:  Tuesday December 22 2020 13:30:39 EDT Ventricular Rate:  87 PR Interval:  136 QRS Duration: 78 QT Interval:  352 QTC Calculation: 423 R Axis:   86 Text Interpretation: Sinus rhythm with sinus arrhythmia with occasional Premature ventricular complexes Otherwise normal ECG NSR with sinus arrhythmia Confirmed by Lavenia Atlas [phone]) on 12/22/2020 6:04:46 PM  Radiology DG Chest 2 View  Result Date: 12/22/2020 CLINICAL DATA:  Shortness of breath and swelling in feet. EXAM: CHEST - 2 VIEW COMPARISON:  09/05/2015 FINDINGS: The cardiac silhouette, mediastinal and hilar contours are within normal limits and stable. There is tortuosity of the thoracic aorta which is stable. Stable surgical changes from coronary artery bypass surgery. No acute pulmonary findings. No worrisome pulmonary lesions or pleural effusions. The bony thorax is intact. IMPRESSION: No acute cardiopulmonary findings. Electronically Signed   By: Marijo Sanes M.D.   On: 12/22/2020 12:33     Procedures Procedures   Medications Ordered in ED Medications  albuterol (VENTOLIN HFA) 108 (90 Base) MCG/ACT inhaler 1 puff (has no administration in time range)    ED Course  I have reviewed the triage vital signs and the nursing notes.  Pertinent labs & imaging results that were available during my care of the patient were reviewed by me and considered in my medical decision making (see chart for details).    MDM Rules/Calculators/A&P  87yo M with a past medical history of hypertension, CAD presenting to the ED with daughter for chief complaint of wheezing.  Daughter noticed a cough and wheezing for the past 4 days.  Cough has been dry.  Patient without any complaints of pain.  Denies any chest pain, abdominal pain, vomiting, leg swelling, fever or sick contacts.  He does have some wheezing on exam.  He is not hypoxic.  EKG here shows sinus rhythm with sinus arrhythmia.  Chest x-ray done in urgent care without any abnormalities.  BMP, CBC and BNP unremarkable.  Suspect viral cause, no evidence of pneumonia, edema or STEMI or other emergent cause.  Will treat with albuterol as needed.  Return precautions given.   Patient is hemodynamically stable, in NAD, and able to ambulate in the ED. Evaluation does not show pathology that would require ongoing emergent intervention or inpatient treatment. I explained the diagnosis to the patient. Pain has been managed and has no complaints prior to discharge. Patient is comfortable with above plan and is stable for discharge at this time. All questions were answered prior to disposition. Strict return precautions for returning to the ED were discussed. Encouraged follow up with PCP.   An After Visit Summary was printed and given to the patient.   Portions of this note were generated with [employer]. Dictation errors may occur despite best attempts at proofreading.  Final Clinical Impression(s) / ED  Diagnoses Final diagnoses:  Cough    Rx / DC Orders ED Discharge Orders     None        Delia Heady, PA-C 12/22/20 1829    Lorelle Gibbs, DO 12/22/20 2042

## 2020-12-22 NOTE — ED Triage Notes (Signed)
Pt with sore throat, cough, rattling noise when breathing per daughter. Daughter has also noticed some swelling in both feet recently. Audible wheezing noted. Denies chest pain. Sat 91-92% on RA.   Pt HOH.

## 2020-12-22 NOTE — ED Notes (Signed)
RN reviewed discharge instructions w/ pt. Follow up and inhaler use reviewed, pt had no further questions

## 2020-12-22 NOTE — Discharge Instructions (Signed)
Use the inhaler as needed. Follow-up with your primary care provider. Return to the ER if you start to experience worsening cough, wheezing, chest pain, vomiting or fever

## 2020-12-22 NOTE — ED Triage Notes (Signed)
Pt sent from UC for further eval of shob, BLE swelling, and wheezing x a few days. Concerned for new onset CHF.

## 2020-12-22 NOTE — Discharge Instructions (Addendum)
-  Your EKG and chest x-ray look good today.  I am concerned that you have acute heart failure or respiratory failure based on your symptoms today.  Please send straight to [facility] or [facility] emergency department for further evaluation and management.  It is important that you head straight there, as this can be failure without treatment.

## 2020-12-22 NOTE — ED Notes (Signed)
Pt had urinated himself prior to being brought back to [facility]. Told pt I was going to clean him up and put him into a gown. Pt & pt's visitor asked that I did not remove the pt's pants. I explained to pt and visitor that he was sitting in urine and that it would be best to change and clean him. Pt still refused. RN notified.

## 2020-12-22 NOTE — ED Notes (Signed)
Patient is being discharged from the Urgent Care and sent to the Emergency Department via POV. Per Marin Roberts PA, patient is in need of higher level of care due to shortness of breath, concern for respiratory failure/heart failure. Patient is aware and verbalizes understanding of plan of care.  Vitals:   12/22/20 1146 12/22/20 1147  BP: 125/72   Pulse:    Resp:  (!) 22  Temp: 99.1 F (37.3 C)   SpO2:

## 2020-12-22 NOTE — ED Notes (Signed)
RN asked pt if we could removed his pants, as they are soiled. Pt and visitor refused, asked why we wanted his pants off. RN explained that pt's pants were visibly soiled and sitting in urine would cause skin breakdown. Pt's pants and underwear removed, pad placed under pt.

## 2020-12-22 NOTE — ED Provider Notes (Signed)
[facility]    CSN: [id number] Arrival date & time: 12/22/20  1111      History   Chief Complaint Chief Complaint  Patient presents with   shob   Wheezing   Cough   Sore Throat    HPI Devin Hunter is a 87 y.o. male presenting with sore throat, cough, wheezing, pedal edema x3 days. Medical history CAD s/p CABG, hyperlipidemia, hypertension.  Patient is not on oxygen at home. Describes cough as dry and hacking. Denies nasal congestion, productive cough, fevers/chills, n/v/d/c, dizziness, chest pain, focal weakness.   HPI  Past Medical History:  Diagnosis Date   CAD (coronary artery disease) 1996   CABG w/ LIMA-LAD, SVG-OM, and SVG-PDA   Hyperlipidemia LDL goal <70    Hypertension     Patient Active Problem List   Diagnosis Date Noted   Erectile dysfunction 05/15/2014   CAD (coronary artery disease) 06/02/2013   HTN (hypertension) 06/02/2013   Hyperlipidemia with target LDL less than 70 06/02/2013   PSA elevation 06/02/2013    Past Surgical History:  Procedure Laterality Date   CORONARY ARTERY BYPASS GRAFT  1996   LIMA-LAD, SVG-OM, and SVG-PDA       Home Medications    Prior to Admission medications   Medication Sig Start Date End Date Taking? Authorizing Provider  aspirin 81 MG tablet Take 81 mg by mouth daily.   Yes [provider]  carvedilol (COREG) 3.125 MG tablet TAKE 1 TABLET(3.125 MG) BY MOUTH TWICE DAILY 01/14/20  Yes Barrett, Evelene Croon, PA-C  furosemide (LASIX) 20 MG tablet Take 1 tablet (20 mg total) by mouth every other day. PATIENT MUST SCHEDULE APPOINTMENT FOR FUTURE REFILLS. **2ND ATTEMPT** 12/04/20  Yes Troy Sine, MD  potassium chloride (KLOR-CON) 10 MEQ tablet TAKE 1 TABLET BY MOUTH EVERY OTHER DAY, TAKE WITH LASIX 11/03/20  Yes Troy Sine, MD    Family History History reviewed. No pertinent family history.  Social History Social History   Tobacco Use   Smoking status: Never   Smokeless tobacco: Never   Substance Use Topics   Alcohol use: No   Drug use: Not Currently     Allergies   Patient has no known allergies.   Review of Systems Review of Systems  Constitutional:  Negative for appetite change, chills and fever.  HENT:  Positive for congestion. Negative for ear pain, rhinorrhea, sinus pressure, sinus pain and sore throat.   Eyes:  Negative for redness and visual disturbance.  Respiratory:  Positive for cough, shortness of breath and wheezing. Negative for chest tightness.   Cardiovascular:  Negative for chest pain and palpitations.  Gastrointestinal:  Negative for abdominal pain, constipation, diarrhea, nausea and vomiting.  Genitourinary:  Negative for dysuria, frequency and urgency.  Musculoskeletal:  Negative for myalgias.  Neurological:  Negative for dizziness, weakness and headaches.  Psychiatric/Behavioral:  Negative for confusion.   All other systems reviewed and are negative.   Physical Exam Triage Vital Signs ED Triage Vitals  Enc Vitals Group     BP 12/22/20 1146 125/72     Pulse Rate 12/22/20 1145 90     Resp 12/22/20 1147 (!) 22     Temp 12/22/20 1146 99.1 F (37.3 C)     Temp src --      SpO2 12/22/20 1145 95 %     Weight --      Height --      Head Circumference --      Peak  Flow --      Pain Score 12/22/20 1144 0     Pain Loc --      Pain Edu? --      Excl. in [place]? --    No data found.  Updated Vital Signs BP 125/72   Pulse 90   Temp 99.1 F (37.3 C)   Resp (!) 22   SpO2 95%   Visual Acuity Right Eye Distance:   Left Eye Distance:   Bilateral Distance:    Right Eye Near:   Left Eye Near:    Bilateral Near:     Physical Exam Vitals reviewed.  Constitutional:      General: He is not in acute distress.    Appearance: Normal appearance. He is not ill-appearing.  HENT:     Head: Normocephalic and atraumatic.     Right Ear: Hearing, tympanic membrane, ear canal and external ear normal. No swelling or tenderness. There is no  impacted cerumen. No mastoid tenderness. Tympanic membrane is not perforated, erythematous, retracted or bulging.     Left Ear: Hearing, tympanic membrane, ear canal and external ear normal. No swelling or tenderness. There is no impacted cerumen. No mastoid tenderness. Tympanic membrane is not perforated, erythematous, retracted or bulging.     Nose:     Right Sinus: No maxillary sinus tenderness or frontal sinus tenderness.     Left Sinus: No maxillary sinus tenderness or frontal sinus tenderness.     Mouth/Throat:     Mouth: Mucous membranes are moist.     Pharynx: Uvula midline. No oropharyngeal exudate or posterior oropharyngeal erythema.     Tonsils: No tonsillar exudate.  Cardiovascular:     Rate and Rhythm: Normal rate and regular rhythm.     Pulses:          Radial pulses are 2+ on the right side and 2+ on the left side.     Heart sounds: Normal heart sounds.     Comments: Trace pedal edema No calf swelling/tenderness Pulmonary:     Effort: Tachypnea present.     Breath sounds: Normal air entry. Wheezing present. No rhonchi or rales.     Comments: Wheezes throughout, worse in lower lung fields Chest:     Chest wall: No tenderness.  Abdominal:     General: Abdomen is flat. Bowel sounds are normal.     Tenderness: There is no abdominal tenderness. There is no guarding or rebound.  Lymphadenopathy:     Cervical: No cervical adenopathy.  Neurological:     General: No focal deficit present.     Mental Status: He is alert and oriented to person, place, and time.  Psychiatric:        Attention and Perception: Attention and perception normal.        Mood and Affect: Mood and affect normal.        Behavior: Behavior normal. Behavior is cooperative.        Thought Content: Thought content normal.        Judgment: Judgment normal.     UC Treatments / Results  Labs (all labs ordered are listed, but only abnormal results are displayed) Labs Reviewed - No data to  display  EKG   Radiology DG Chest 2 View  Result Date: 12/22/2020 CLINICAL DATA:  Shortness of breath and swelling in feet. EXAM: CHEST - 2 VIEW COMPARISON:  09/05/2015 FINDINGS: The cardiac silhouette, mediastinal and hilar contours are within normal limits and stable. There is tortuosity of  the thoracic aorta which is stable. Stable surgical changes from coronary artery bypass surgery. No acute pulmonary findings. No worrisome pulmonary lesions or pleural effusions. The bony thorax is intact. IMPRESSION: No acute cardiopulmonary findings. Electronically Signed   By: Marijo Sanes M.D.   On: 12/22/2020 12:33    Procedures Procedures (including critical care time)  Medications Ordered in UC Medications - No data to display  Initial Impression / Assessment and Plan / UC Course  I have reviewed the triage vital signs and the nursing notes.  Pertinent labs & imaging results that were available during my care of the patient were reviewed by me and considered in my medical decision making (see chart for details).     This patient is a 87 year old male presenting with shortness of breath, wheezing, trace pedal edema.  He is borderline tachypneic.  Oxygenating at 91% on room air, 95% on 2 L by nasal cannula.  He does not use oxygen at home.  Wheezes throughout. Afebrile.  EKG unchanged from 2019 EKG. CXR- No acute cardiopulmonary findings. Pt with history CAD.  I am concerned for heart failure versus respiratory failure versus PE versus other.  I am recommending this patient head straight to the emergency department for further evaluation and management. Declines transport via EMS. Pt sitting comfortably even without O2 via nasal cannula, so I am in agreement they can proceed by personal vehicle if they head straight there.   Patient's daughter verbalizes they will head straight to the ED.  Final Clinical Impressions(s) / UC Diagnoses   Final diagnoses:  Wheezing  Shortness of breath      Discharge Instructions      -Your EKG and chest x-ray look good today.  I am concerned that you have acute heart failure or respiratory failure based on your symptoms today.  Please send straight to [facility] or [facility] emergency department for further evaluation and management.  It is important that you head straight there, as this can be failure without treatment.     ED Prescriptions   None    PDMP not reviewed this encounter.   Hazel Sams, PA-C 12/22/20 1257

## 2020-12-29 ENCOUNTER — Telehealth: Payer: Self-pay | Admitting: Adult Health

## 2020-12-29 NOTE — Telephone Encounter (Signed)
Please advise 

## 2020-12-29 NOTE — Telephone Encounter (Signed)
Patient granddaughter Devin Hunter calling to see if [name] can take patient back as a patient again. Patient was last seen in 2019 but when Covid hit patient never went to a doctor again. Please call Devin Hunter at [phone].

## 2020-12-30 NOTE — Telephone Encounter (Signed)
Noted  

## 2020-12-31 NOTE — Telephone Encounter (Signed)
Patient granddaughter notified of update  and verbalized understanding.

## 2021-01-19 ENCOUNTER — Other Ambulatory Visit: Payer: Self-pay

## 2021-01-19 ENCOUNTER — Ambulatory Visit (INDEPENDENT_AMBULATORY_CARE_PROVIDER_SITE_OTHER): Payer: Medicare Other | Admitting: Adult Health

## 2021-01-19 ENCOUNTER — Encounter: Payer: Self-pay | Admitting: Adult Health

## 2021-01-19 VITALS — BP 128/84 | HR 70 | Temp 98.1°F | Ht 68.0 in | Wt 130.0 lb

## 2021-01-19 DIAGNOSIS — R059 Cough, unspecified: Secondary | ICD-10-CM

## 2021-01-19 DIAGNOSIS — R062 Wheezing: Secondary | ICD-10-CM

## 2021-01-19 NOTE — Progress Notes (Signed)
Subjective:    Patient ID: Devin Hunter, male    DOB: Aug 21, 1919, 87 y.o.   MRN: [id number]  HPI 87 year old male who  has a past medical history of CAD (coronary artery disease) (1996), Hyperlipidemia LDL goal <70, and Hypertension.  He presents to the office today with his granddaughter and son.   He was seen in the ER on 12/22/2020 for the main complaint of a cough wheezing.  His symptoms have been present for 4 days prior.  He denied chest pain, fever, vomiting, diarrhea, abdominal pain.  No history of chronic lung disease or tobacco use.  His EKG showed sinus rhythm with sinus arrhythmia.  Chest x-ray done at urgent care prior to going the ER without any abnormalities, BMP, CBC, and BMP unremarkable.  It was suspected that he had a viral cause with no evidence of pneumonia, edema, or STEMI.  He was treated with an albuterol inhaler  Today he reports that he is feeling better, and family that is with him today reiterate this.  His cough has improved tremendously, his cough now is intermittent.  He is no longer having to use an inhaler.  There has been no wheezing, shortness of breath, or gasping.    Review of Systems See HPI   Past Medical History:  Diagnosis Date   CAD (coronary artery disease) 1996   CABG w/ LIMA-LAD, SVG-OM, and SVG-PDA   Hyperlipidemia LDL goal <70    Hypertension     Social History   Socioeconomic History   Marital status: Widowed    Spouse name: Not on file   Number of children: Not on file   Years of education: Not on file   Highest education level: Not on file  Occupational History   Not on file  Tobacco Use   Smoking status: Never   Smokeless tobacco: Never  Substance and Sexual Activity   Alcohol use: No   Drug use: Not Currently   Sexual activity: Not on file  Other Topics Concern   Not on file  Social History Narrative   Retired    Investment banker, operational of Radio broadcast assistant Strain: Not on file  Food Insecurity: Not on  file  Transportation Needs: Not on file  Physical Activity: Not on file  Stress: Not on file  Social Connections: Not on file  Intimate Partner Violence: Not on file    Past Surgical History:  Procedure Laterality Date   [place]   LIMA-LAD, SVG-OM, and SVG-PDA    History reviewed. No pertinent family history.  No Known Allergies  Current Outpatient Medications on File Prior to Visit  Medication Sig Dispense Refill   aspirin 81 MG tablet Take 81 mg by mouth daily.     carvedilol (COREG) 3.125 MG tablet TAKE 1 TABLET(3.125 MG) BY MOUTH TWICE DAILY 180 tablet 3   furosemide (LASIX) 20 MG tablet Take 1 tablet (20 mg total) by mouth every other day. PATIENT MUST SCHEDULE APPOINTMENT FOR FUTURE REFILLS. **2ND ATTEMPT** 15 tablet 0   latanoprost (XALATAN) 0.005 % ophthalmic solution 1 drop at bedtime.     potassium chloride (KLOR-CON) 10 MEQ tablet TAKE 1 TABLET BY MOUTH EVERY OTHER DAY, TAKE WITH LASIX 45 tablet 3   timolol (TIMOPTIC) 0.5 % ophthalmic solution 1 drop every morning.     No current facility-administered medications on file prior to visit.    BP 128/84   Pulse 70   Temp 98.1 F (  36.7 C) (Oral)   Ht '5\' 8"'$  (1.727 m)   Wt 130 lb (59 kg)   SpO2 97%   BMI 19.77 kg/m       Objective:   Physical Exam Vitals and nursing note reviewed.  Constitutional:      Appearance: Normal appearance.  Cardiovascular:     Rate and Rhythm: Normal rate and regular rhythm.     Pulses: Normal pulses.     Heart sounds: Normal heart sounds.  Pulmonary:     Effort: Pulmonary effort is normal.     Breath sounds: Normal breath sounds.  Skin:    General: Skin is warm and dry.  Neurological:     General: No focal deficit present.     Mental Status: He is alert. Mental status is at baseline.  Psychiatric:        Mood and Affect: Mood normal.        Behavior: Behavior normal.        Thought Content: Thought content normal.        Judgment: Judgment  normal.      Assessment & Plan:  1. Coughing -Symptoms seem to be resolving if not resolved.  Advised that he can continue with inhaler as needed for cough.  Advised family that this was most likely viral in origin and will resolve over the next few weeks completely.  Follow-up as needed  2. Wheezing - Resolved   Dorothyann Peng, NP

## 2021-01-19 NOTE — Patient Instructions (Addendum)
It was great seeing you today and I am glad that cough is almost gone.   Please follow up with your cardiologist   We can see each other every 6 months or sooner if needed    [facility]  Phone: [phone]  [facility] [place], [place]  [phone]

## 2021-02-23 ENCOUNTER — Telehealth: Payer: Self-pay | Admitting: Adult Health

## 2021-02-23 NOTE — Telephone Encounter (Signed)
Left message for patient to call back and schedule Medicare Annual Wellness Visit (AWV) either virtually or in office. Left  my jabber number [phone]    awvi 07/04/09 per [facility]  please schedule at anytime with [facility] Nurse Health Advisor 1 or 2   This should be a 45 minute visit.  

## 2021-04-13 ENCOUNTER — Telehealth: Payer: Self-pay

## 2021-04-13 NOTE — Telephone Encounter (Signed)
Daughter of patient call to request refills for pt listed below  potassium chloride (KLOR-CON) 10 MEQ tablet furosemide (LASIX) 20 MG tablet carvedilol (COREG) 3.125 MG tablet

## 2021-04-14 ENCOUNTER — Encounter (HOSPITAL_COMMUNITY): Payer: Self-pay | Admitting: Emergency Medicine

## 2021-04-14 ENCOUNTER — Emergency Department (HOSPITAL_COMMUNITY)
Admission: EM | Admit: 2021-04-14 | Discharge: 2021-04-14 | Disposition: A | Discharge status: Home / Self Care | Payer: Medicare Other | Attending: Emergency Medicine | Admitting: Emergency Medicine

## 2021-04-14 ENCOUNTER — Emergency Department (HOSPITAL_COMMUNITY): Payer: Medicare Other

## 2021-04-14 DIAGNOSIS — I10 Essential (primary) hypertension: Secondary | ICD-10-CM | POA: Diagnosis not present

## 2021-04-14 DIAGNOSIS — Z951 Presence of aortocoronary bypass graft: Secondary | ICD-10-CM | POA: Insufficient documentation

## 2021-04-14 DIAGNOSIS — R339 Retention of urine, unspecified: Secondary | ICD-10-CM | POA: Diagnosis present

## 2021-04-14 DIAGNOSIS — Z7982 Long term (current) use of aspirin: Secondary | ICD-10-CM | POA: Diagnosis not present

## 2021-04-14 DIAGNOSIS — I2510 Atherosclerotic heart disease of native coronary artery without angina pectoris: Secondary | ICD-10-CM | POA: Insufficient documentation

## 2021-04-14 DIAGNOSIS — Z79899 Other long term (current) drug therapy: Secondary | ICD-10-CM | POA: Insufficient documentation

## 2021-04-14 DIAGNOSIS — R1030 Lower abdominal pain, unspecified: Secondary | ICD-10-CM | POA: Diagnosis not present

## 2021-04-14 LAB — URINALYSIS, ROUTINE W REFLEX MICROSCOPIC
Bacteria, UA: NONE SEEN
Bilirubin Urine: NEGATIVE
Glucose, UA: NEGATIVE mg/dL
Ketones, ur: NEGATIVE mg/dL
Leukocytes,Ua: NEGATIVE
Nitrite: NEGATIVE
Protein, ur: 30 mg/dL — AB
RBC / HPF: >50 RBC/hpf — ABNORMAL HIGH (ref 0–5)
Specific Gravity, Urine: 1.015 (ref 1.005–1.030)
pH: 5 (ref 5.0–8.0)

## 2021-04-14 LAB — CBC WITH DIFFERENTIAL/PLATELET
Abs Immature Granulocytes: 0.09 10*3/uL — ABNORMAL HIGH (ref 0.00–0.07)
Basophils Absolute: 0 10*3/uL (ref 0.0–0.1)
Basophils Relative: 0 %
Eosinophils Absolute: 0 10*3/uL (ref 0.0–0.5)
Eosinophils Relative: 0 %
HCT: 40.7 % (ref 39.0–52.0)
Hemoglobin: 13.1 g/dL (ref 13.0–17.0)
Immature Granulocytes: 1 %
Lymphocytes Relative: 9 %
Lymphs Abs: 1.3 10*3/uL (ref 0.7–4.0)
MCH: 31.4 pg (ref 26.0–34.0)
MCHC: 32.2 g/dL (ref 30.0–36.0)
MCV: 97.6 fL (ref 80.0–100.0)
Monocytes Absolute: 1.1 10*3/uL — ABNORMAL HIGH (ref 0.1–1.0)
Monocytes Relative: 7 %
Neutro Abs: 12.2 10*3/uL — ABNORMAL HIGH (ref 1.7–7.7)
Neutrophils Relative %: 83 %
Platelets: 262 10*3/uL (ref 150–400)
RBC: 4.17 MIL/uL — ABNORMAL LOW (ref 4.22–5.81)
RDW: 13.9 % (ref 11.5–15.5)
WBC: 14.6 10*3/uL — ABNORMAL HIGH (ref 4.0–10.5)
nRBC: 0 % (ref 0.0–0.2)

## 2021-04-14 LAB — COMPREHENSIVE METABOLIC PANEL
ALT: 15 U/L (ref 0–44)
AST: 32 U/L (ref 15–41)
Albumin: 4 g/dL (ref 3.5–5.0)
Alkaline Phosphatase: 80 U/L (ref 38–126)
Anion gap: 10 (ref 5–15)
BUN: 22 mg/dL (ref 8–23)
CO2: 26 mmol/L (ref 22–32)
Calcium: 10.6 mg/dL — ABNORMAL HIGH (ref 8.9–10.3)
Chloride: 106 mmol/L (ref 98–111)
Creatinine, Ser: 2.21 mg/dL — ABNORMAL HIGH (ref 0.61–1.24)
GFR, Estimated: 26 mL/min — ABNORMAL LOW (ref >60)
Glucose, Bld: 145 mg/dL — ABNORMAL HIGH (ref 70–99)
Potassium: 4.6 mmol/L (ref 3.5–5.1)
Sodium: 142 mmol/L (ref 135–145)
Total Bilirubin: 1 mg/dL (ref 0.3–1.2)
Total Protein: 6.8 g/dL (ref 6.5–8.1)

## 2021-04-14 MED ORDER — SODIUM CHLORIDE 0.9 % IV BOLUS
500.0000 mL | Freq: Once | INTRAVENOUS | Status: AC
  Administered 2021-04-14: 500 mL via INTRAVENOUS

## 2021-04-14 MED ORDER — CEPHALEXIN 500 MG PO CAPS: 500.0000 mg | ORAL_CAPSULE | Freq: Two times a day (BID) | ORAL | 0 refills | Status: AC

## 2021-04-14 MED ORDER — CEPHALEXIN 250 MG PO CAPS
500.0000 mg | ORAL_CAPSULE | Freq: Once | ORAL | Status: AC
  Administered 2021-04-14: 500 mg via ORAL
  Filled 2021-04-14: qty 2

## 2021-04-14 NOTE — Telephone Encounter (Signed)
Spoke to pt daughter and advised that these Rx came from cardio. Pt will need to f/u with them for refill. Pt daughter verbalized understanding.

## 2021-04-14 NOTE — Discharge Instructions (Addendum)
As discussed with your acute urinary retention is importantly follow-up with your primary care physician and our urology colleagues.  Please call your primary care physician tomorrow to schedule outpatient follow-up in the coming days, and a urologist follow-up next week.  Pertinent results from today's CT scan are included below.  You should discuss these with your physician, for additional studies as needed.  CT results:IMPRESSION: 1. Markedly enlarged prostate. 2. Punctate 2 mm nonobstructing right renal calculus. 3. Innumerable small lucencies identified throughout the visualized skeletal structures, concerning for diffuse myeloma involvement versus metastatic disease. 4. Bilateral lower lobe pulmonary nodules, largest measuring mean diameter 8 mm on the right. Recommend a non-contrast Chest CT at 3-6 months, then another non-contrast Chest CT at 18-24 months. 5.  Aortic Atherosclerosis (ICD10-I70.0).

## 2021-04-14 NOTE — ED Triage Notes (Signed)
Pt reports not being able to urinate since Saturday, no trouble with bowels. Bladder scan showing 564 in triage.

## 2021-04-14 NOTE — ED Provider Notes (Signed)
[facility] EMERGENCY DEPARTMENT Provider Note   CSN: [id number] Arrival date & time: 04/14/21  1439     History Chief Complaint  Patient presents with   Urinary Retention    Devin Hunter is a 87 y.o. male.  HPI Pleasant adult male presents with his daughter who assists with history.  Patient has a history of hypertension, hyperlipidemia, but is generally well.  He has no prior history of urinary retention.  He presents today due to minimal urine production since 3 days ago.  There is increased abdominal discomfort in the lower abdomen, pressure-like, this has improved, as the patient received a Foley catheter as I was doing my initial exam.  He denies other complaints including dyspnea, syncope, fever, cough. Daughter notes the patient has had incontinence for some time, has been unwilling to wear an adult diaper.     Past Medical History:  Diagnosis Date   CAD (coronary artery disease) 1996   CABG w/ LIMA-LAD, SVG-OM, and SVG-PDA   Hyperlipidemia LDL goal <70    Hypertension     Patient Active Problem List   Diagnosis Date Noted   Erectile dysfunction 05/15/2014   CAD (coronary artery disease) 06/02/2013   HTN (hypertension) 06/02/2013   Hyperlipidemia with target LDL less than 70 06/02/2013   PSA elevation 06/02/2013    Past Surgical History:  Procedure Laterality Date   CORONARY ARTERY BYPASS GRAFT  1996   LIMA-LAD, SVG-OM, and SVG-PDA       No family history on file.  Social History   Tobacco Use   Smoking status: Never   Smokeless tobacco: Never  Substance Use Topics   Alcohol use: No   Drug use: Not Currently    Home Medications Prior to Admission medications   Medication Sig Start Date End Date Taking? Authorizing Provider  aspirin 81 MG tablet Take 81 mg by mouth daily.    [provider]  carvedilol (COREG) 3.125 MG tablet TAKE 1 TABLET(3.125 MG) BY MOUTH TWICE DAILY 01/14/20   Barrett, Evelene Croon, PA-C  furosemide  (LASIX) 20 MG tablet Take 1 tablet (20 mg total) by mouth every other day. PATIENT MUST SCHEDULE APPOINTMENT FOR FUTURE REFILLS. **2ND ATTEMPT** 12/04/20   Troy Sine, MD  latanoprost (XALATAN) 0.005 % ophthalmic solution 1 drop at bedtime. 12/08/20   [provider]  potassium chloride (KLOR-CON) 10 MEQ tablet TAKE 1 TABLET BY MOUTH EVERY OTHER DAY, TAKE WITH LASIX 11/03/20   Troy Sine, MD  timolol (TIMOPTIC) 0.5 % ophthalmic solution 1 drop every morning. 10/31/20   [provider]    Allergies    Patient has no known allergies.  Review of Systems   Review of Systems  Constitutional:        Per HPI, otherwise negative  HENT:         Per HPI, otherwise negative  Respiratory:         Per HPI, otherwise negative  Cardiovascular:        Per HPI, otherwise negative  Gastrointestinal:  Negative for vomiting.  Endocrine:       Negative aside from HPI  Genitourinary:        Neg aside from HPI   Musculoskeletal:        Per HPI, otherwise negative  Skin: Negative.   Neurological:  Negative for syncope.   Physical Exam Updated Vital Signs BP 129/70 (BP Location: Left Arm)   Pulse 88   Temp 98.5 F (36.9 C)  Resp 16   SpO2 99%   Physical Exam Vitals and nursing note reviewed.  Constitutional:      General: He is not in acute distress.    Appearance: He is well-developed.  HENT:     Head: Normocephalic and atraumatic.  Eyes:     Conjunctiva/sclera: Conjunctivae normal.  Cardiovascular:     Rate and Rhythm: Normal rate and regular rhythm.  Pulmonary:     Effort: Pulmonary effort is normal. No respiratory distress.     Breath sounds: No stridor.  Abdominal:     General: There is no distension.  Genitourinary:    Comments: Foley catheter exiting meatus, no surrounding erythema, bleeding Skin:    General: Skin is warm and dry.  Neurological:     Mental Status: He is alert and oriented to person, place, and time.    ED Results / Procedures /  Treatments   Labs (all labs ordered are listed, but only abnormal results are displayed) Labs Reviewed  COMPREHENSIVE METABOLIC PANEL - Abnormal; Notable for the following components:      Result Value   Glucose, Bld 145 (*)    Creatinine, Ser 2.21 (*)    Calcium 10.6 (*)    GFR, Estimated 26 (*)    All other components within normal limits  CBC WITH DIFFERENTIAL/PLATELET - Abnormal; Notable for the following components:   WBC 14.6 (*)    RBC 4.17 (*)    Neutro Abs 12.2 (*)    Monocytes Absolute 1.1 (*)    Abs Immature Granulocytes 0.09 (*)    All other components within normal limits  URINALYSIS, ROUTINE W REFLEX MICROSCOPIC - Abnormal; Notable for the following components:   Color, Urine AMBER (*)    APPearance CLOUDY (*)    Hgb urine dipstick MODERATE (*)    Protein, ur 30 (*)    RBC / HPF >50 (*)    All other components within normal limits    EKG None  Radiology CT Renal Stone Study  Result Date: 04/14/2021 CLINICAL DATA:  Hematuria, inability to urinate EXAM: CT ABDOMEN AND PELVIS WITHOUT CONTRAST TECHNIQUE: Multidetector CT imaging of the abdomen and pelvis was performed following the standard protocol without IV contrast. COMPARISON:  None. FINDINGS: Lower chest: Emphysematous changes and scarring are seen at the lung bases. There are bilateral lower lobe pulmonary nodules. On the right, there is a 10 x 6 mm subpleural right lower lobe nodule reference image 4/4. On the left, there is a 5 x 5 mm left lower lobe nodule reference image 5/4. Hepatobiliary: No focal liver abnormality is seen. No gallstones, gallbladder wall thickening, or biliary dilatation. Pancreas: Unremarkable. No pancreatic ductal dilatation or surrounding inflammatory changes. Spleen: Normal in size without focal abnormality. Adrenals/Urinary Tract: Punctate 2 mm nonobstructing right renal calculus. Simple appearing left renal cysts. No obstructive uropathy within either kidney. Bladder is decompressed  with a Foley catheter. No adrenal abnormalities. Stomach/Bowel: No bowel obstruction or ileus. No bowel wall thickening or inflammatory change. Vascular/Lymphatic: Aortic atherosclerosis. No enlarged abdominal or pelvic lymph nodes. Reproductive: There is marked enlargement the prostate, measuring proximally 6.5 by 5.3 by 6.2 cm. Other: No free fluid or free gas. There is a fat containing right inguinal hernia. No bowel herniation. Musculoskeletal: There are innumerable small lucencies identified throughout the visualized skeletal structures involving the thoracic cage, thoracolumbar spine, bony pelvis. Findings could reflect diffuse multiple myeloma or metastatic disease. There are no acute displaced fractures. Reconstructed images demonstrate no additional findings. IMPRESSION: 1.  Markedly enlarged prostate. 2. Punctate 2 mm nonobstructing right renal calculus. 3. Innumerable small lucencies identified throughout the visualized skeletal structures, concerning for diffuse myeloma involvement versus metastatic disease. 4. Bilateral lower lobe pulmonary nodules, largest measuring mean diameter 8 mm on the right. Recommend a non-contrast Chest CT at 3-6 months, then another non-contrast Chest CT at 18-24 months. 5.  Aortic Atherosclerosis (ICD10-I70.0). Electronically Signed   By: Randa Ngo M.D.   On: 04/14/2021 18:45    Procedures Procedures   Medications Ordered in ED Medications  sodium chloride 0.9 % bolus 500 mL (0 mLs Intravenous Stopped 04/14/21 1808)    ED Course  I have reviewed the triage vital signs and the nursing notes.  Pertinent labs & imaging results that were available during my care of the patient were reviewed by me and considered in my medical decision making (see chart for details).   7:22 PM Patient awake, alert, in no distress, again states that he feel substantially better having received his Foley catheter.  Have reviewed his labs, discussed findings from the CT scan with  the patient's niece.  Findings with abnormalities that require close outpatient follow-up, she states that she will follow-up with her uncles physician in the coming days.  We discussed possibilities including multiple myeloma versus metastatic involvement, but there is no discretely identified mass, nor obvious lesion causing urinary obstruction.  Patient remains hemodynamically unremarkable. Labs notable for leukocytosis, hemoglobin urea.  Urinalysis negative for nitrates, leukocytes, patient is in no distress, afebrile, low suspicion for bacteremia, sepsis.  Patient does have elevated creatinine, but normal BUN consistent with poor oral intake.  Patient has received 1 L fluid resuscitation here, has had substantial urine production.  Patient is capable of drinking fluids orally, continue resuscitation at home.  With successful placement of Foley catheter, with need for outpatient follow-up for his multiple abnormalities, patient appropriate for discharge, though with close outpatient follow-up as above.  MDM Rules/Calculators/A&P MDM Number of Diagnoses or Management Options Urinary retention: new, needed workup   Amount and/or Complexity of Data Reviewed Clinical lab tests: ordered and reviewed Tests in the radiology section of CPT: ordered and reviewed Tests in the medicine section of CPT: reviewed and ordered Decide to obtain previous medical records or to obtain history from someone other than the patient: yes Obtain history from someone other than the patient: yes Review and summarize past medical records: yes Independent visualization of images, tracings, or specimens: yes  Risk of Complications, Morbidity, and/or Mortality Presenting problems: high Diagnostic procedures: high Management options: high  Critical Care Total time providing critical care: < 30 minutes  Patient Progress Patient progress: improved   Final Clinical Impression(s) / ED Diagnoses Final diagnoses:   Urinary retention     Carmin Muskrat, MD 04/14/21 1926

## 2021-04-14 NOTE — ED Provider Notes (Signed)
Emergency Medicine Provider Triage Evaluation Note  Devin Hunter , a 87 y.o. male  was evaluated in triage.  Pt complains of urinary retention.  Has not been able to void since Saturday, reports occasional trickling of stream.  There is no pain with urination or hematuria.  Denies any abdominal pain, nausea, vomiting.  No recent changes in medicine, he is taking his Lasix..  Review of Systems  Positive: Urinary retention Negative: Dysuria, hematuria  Physical Exam  There were no vitals taken for this visit. Gen:   Awake, no distress   Resp:  Normal effort  MSK:   Moves extremities without difficulty  Other:  Abdomen is soft, nontender  Medical Decision Making  Medically screening exam initiated at 2:50 PM.  Appropriate orders placed.  Devin Hunter was informed that the remainder of the evaluation will be completed by another provider, this initial triage assessment does not replace that evaluation, and the importance of remaining in the ED until their evaluation is complete.     Devin Raring, PA-C 04/14/21 [name], Julie, MD 04/14/21 1511

## 2021-04-15 ENCOUNTER — Telehealth: Payer: Self-pay

## 2021-04-15 ENCOUNTER — Ambulatory Visit: Payer: Medicare Other | Admitting: Family Medicine

## 2021-04-15 DIAGNOSIS — R339 Retention of urine, unspecified: Secondary | ICD-10-CM

## 2021-04-15 NOTE — Addendum Note (Signed)
Addended by: Gwenyth Ober R on: 04/15/2021 11:55 AM   Modules accepted: Orders

## 2021-04-15 NOTE — Telephone Encounter (Signed)
Spoke to pt daughter and she stated that pt had an appt. This morning at 9am. However, pt sx had gotten worse through the night so pt was taken to the ED. Per pt daughter she stated that a catheter was placed in pt and per ED physician advised to f/u with PCP to have this removed. After disconnecting with daughter I advised of issue verbally with [facility]. Per Nechama Guard would need to be done at the urologist. Also, pt need to f/u with [facility] regarding labs. Tried to call pt daughter for update but no response. Referral for Uro placed!

## 2021-04-15 NOTE — Telephone Encounter (Signed)
Spoke to pt granddaughter and she is advised of update. Pt has been scheduled for next week. Information given regarding [facility]. No further action needed!

## 2021-04-15 NOTE — Telephone Encounter (Addendum)
Granddaughter called asking for [facility] appt I informed granddaughter of next available date gd asked if patient can be seen sooner because pt has catheter.  I informed gd I would send message to PCP and someone would return her call. Call back # [phone] Baxter Flattery

## 2021-04-15 NOTE — Telephone Encounter (Signed)
Left message to return phone call.

## 2021-04-21 ENCOUNTER — Ambulatory Visit: Payer: Medicare Other | Admitting: Adult Health

## 2021-04-22 ENCOUNTER — Ambulatory Visit (INDEPENDENT_AMBULATORY_CARE_PROVIDER_SITE_OTHER): Payer: Medicare Other | Admitting: Adult Health

## 2021-04-22 ENCOUNTER — Encounter: Payer: Self-pay | Admitting: Adult Health

## 2021-04-22 ENCOUNTER — Other Ambulatory Visit: Payer: Self-pay

## 2021-04-22 VITALS — BP 120/60 | HR 50 | Temp 97.0°F | Ht 68.0 in | Wt 131.0 lb

## 2021-04-22 DIAGNOSIS — R339 Retention of urine, unspecified: Secondary | ICD-10-CM | POA: Diagnosis not present

## 2021-04-22 DIAGNOSIS — R9389 Abnormal findings on diagnostic imaging of other specified body structures: Secondary | ICD-10-CM

## 2021-04-22 NOTE — Progress Notes (Signed)
Subjective:    Patient ID: Devin Hunter, male    DOB: [birth year], 87 y.o.   MRN: [id number]  HPI 87 year old male who  has a past medical history of CAD (coronary artery disease) (1996), Hyperlipidemia LDL goal <70, and Hypertension.  He presents to the office today with daughter and son.   Was seen in the ER 8 days ago for urinary retention that had been present for 3 days prior. Foley catheter was replaced.   CT Renal Stone Study showed   IMPRESSION: 1. Markedly enlarged prostate. 2. Punctate 2 mm nonobstructing right renal calculus. 3. Innumerable small lucencies identified throughout the visualized skeletal structures, concerning for diffuse myeloma involvement versus metastatic disease. 4. Bilateral lower lobe pulmonary nodules, largest measuring mean diameter 8 mm on the right. Recommend a non-contrast Chest CT at 3-6 months, then another non-contrast Chest CT at 18-24 months. 5.  Aortic Atherosclerosis (ICD10-I70.0).  He has been doing well since being discharged. Continue to have foley catheter in place. Has an appointment with Urology in 12 days.   Family wanted to know more about the other diagnosis's that were made in the hospital.    Review of Systems See HPI   Past Medical History:  Diagnosis Date   CAD (coronary artery disease) 1996   CABG w/ LIMA-LAD, SVG-OM, and SVG-PDA   Hyperlipidemia LDL goal <70    Hypertension     Social History   Socioeconomic History   Marital status: Widowed    Spouse name: Not on file   Number of children: Not on file   Years of education: Not on file   Highest education level: Not on file  Occupational History   Not on file  Tobacco Use   Smoking status: Never   Smokeless tobacco: Never  Substance and Sexual Activity   Alcohol use: No   Drug use: Not Currently   Sexual activity: Not on file  Other Topics Concern   Not on file  Social History Narrative   Retired    Investment banker, operational of Systems developer Strain: Not on file  Food Insecurity: Not on file  Transportation Needs: Not on file  Physical Activity: Not on file  Stress: Not on file  Social Connections: Not on file  Intimate Partner Violence: Not on file    Past Surgical History:  Procedure Laterality Date   [facility]   LIMA-LAD, SVG-OM, and SVG-PDA    History reviewed. No pertinent family history.  No Known Allergies  Current Outpatient Medications on File Prior to Visit  Medication Sig Dispense Refill   aspirin 81 MG tablet Take 81 mg by mouth daily.     carvedilol (COREG) 3.125 MG tablet TAKE 1 TABLET(3.125 MG) BY MOUTH TWICE DAILY 180 tablet 3   furosemide (LASIX) 20 MG tablet Take 1 tablet (20 mg total) by mouth every other day. PATIENT MUST SCHEDULE APPOINTMENT FOR FUTURE REFILLS. **2ND ATTEMPT** 15 tablet 0   latanoprost (XALATAN) 0.005 % ophthalmic solution 1 drop at bedtime.     potassium chloride (KLOR-CON) 10 MEQ tablet TAKE 1 TABLET BY MOUTH EVERY OTHER DAY, TAKE WITH LASIX 45 tablet 3   timolol (TIMOPTIC) 0.5 % ophthalmic solution 1 drop every morning.     No current facility-administered medications on file prior to visit.    BP 120/60   Pulse (!) 50   Temp (!) 97 F (36.1 C) (Temporal)   Ht '5\' 8"'$  (1.727 m)  Wt 131 lb (59.4 kg)   SpO2 100%   BMI 19.92 kg/m       Objective:   Physical Exam Vitals and nursing note reviewed.  Constitutional:      Appearance: Normal appearance.  Cardiovascular:     Rate and Rhythm: Normal rate and regular rhythm.     Pulses: Normal pulses.     Heart sounds: Normal heart sounds.  Pulmonary:     Effort: Pulmonary effort is normal.     Breath sounds: Normal breath sounds.  Abdominal:     General: Abdomen is flat. Bowel sounds are normal.     Palpations: Abdomen is soft.  Genitourinary:    Comments: Foley catheter in place. No signs of infection or irritation. Draining clear yellow urine   Skin:    General: Skin is warm and  dry.  Neurological:     General: No focal deficit present.     Mental Status: He is alert and oriented to person, place, and time.  Psychiatric:        Mood and Affect: Mood normal.        Behavior: Behavior normal.      Assessment & Plan:  1. Urinary retention - Follow up with Urology as directed   2. Abnormal CT scan -I discussed at length patient's abnormal CT results, more specifically reading of "  Innumerable small lucencies identified throughout the visualized skeletal structures, concerning for diffuse myeloma involvement versus metastatic disease."  Being that the patient is 87 years old, he and his family would like to go home and think about next steps including seeing oncology, having biopsy, or doing nothing.  Quality of life sounds more important than quantity of life to the patient and his family.  They will update me on what they decide  Dorothyann Peng, NP

## 2021-04-22 NOTE — Patient Instructions (Signed)
It was great seeing you today   Please follow up with urology as scheduled   Let me know about what you decide about seeing the cancer doctor to get more information about what was found on the cat scan

## 2021-04-30 ENCOUNTER — Telehealth: Payer: Self-pay | Admitting: Adult Health

## 2021-04-30 NOTE — Telephone Encounter (Signed)
Left message for patient to call back and schedule Medicare Annual Wellness Visit (AWV) either virtually or in office. Left  my jabber number [phone]    awvi 07/04/09 per [facility]  please schedule at anytime with [facility] Nurse Health Advisor 1 or 2   This should be a 45 minute visit.  

## 2021-05-02 ENCOUNTER — Other Ambulatory Visit: Payer: Self-pay | Admitting: Physician Assistant

## 2021-05-14 ENCOUNTER — Encounter (HOSPITAL_COMMUNITY): Payer: Self-pay | Admitting: Emergency Medicine

## 2021-05-14 ENCOUNTER — Emergency Department (HOSPITAL_COMMUNITY)
Admission: EM | Admit: 2021-05-14 | Discharge: 2021-05-15 | Disposition: A | Discharge status: Home / Self Care | Payer: Medicare Other | Attending: Emergency Medicine | Admitting: Emergency Medicine

## 2021-05-14 ENCOUNTER — Other Ambulatory Visit: Payer: Self-pay

## 2021-05-14 DIAGNOSIS — I10 Essential (primary) hypertension: Secondary | ICD-10-CM | POA: Diagnosis not present

## 2021-05-14 DIAGNOSIS — Z951 Presence of aortocoronary bypass graft: Secondary | ICD-10-CM | POA: Diagnosis not present

## 2021-05-14 DIAGNOSIS — Z978 Presence of other specified devices: Secondary | ICD-10-CM

## 2021-05-14 DIAGNOSIS — Z466 Encounter for fitting and adjustment of urinary device: Secondary | ICD-10-CM | POA: Insufficient documentation

## 2021-05-14 DIAGNOSIS — R339 Retention of urine, unspecified: Secondary | ICD-10-CM | POA: Diagnosis present

## 2021-05-14 DIAGNOSIS — Z79899 Other long term (current) drug therapy: Secondary | ICD-10-CM | POA: Diagnosis not present

## 2021-05-14 DIAGNOSIS — Z7982 Long term (current) use of aspirin: Secondary | ICD-10-CM | POA: Insufficient documentation

## 2021-05-14 DIAGNOSIS — I2510 Atherosclerotic heart disease of native coronary artery without angina pectoris: Secondary | ICD-10-CM | POA: Insufficient documentation

## 2021-05-14 LAB — URINALYSIS, ROUTINE W REFLEX MICROSCOPIC
Bilirubin Urine: NEGATIVE
Glucose, UA: NEGATIVE mg/dL
Ketones, ur: NEGATIVE mg/dL
Nitrite: NEGATIVE
Protein, ur: 100 mg/dL — AB
RBC / HPF: >50 RBC/hpf — ABNORMAL HIGH (ref 0–5)
Specific Gravity, Urine: 1.008 (ref 1.005–1.030)
WBC, UA: >50 WBC/hpf — ABNORMAL HIGH (ref 0–5)
pH: 5 (ref 5.0–8.0)

## 2021-05-14 LAB — BASIC METABOLIC PANEL
Anion gap: 8 (ref 5–15)
BUN: 19 mg/dL (ref 8–23)
CO2: 27 mmol/L (ref 22–32)
Calcium: 9.6 mg/dL (ref 8.9–10.3)
Chloride: 105 mmol/L (ref 98–111)
Creatinine, Ser: 2.11 mg/dL — ABNORMAL HIGH (ref 0.61–1.24)
GFR, Estimated: 28 mL/min — ABNORMAL LOW (ref >60)
Glucose, Bld: 119 mg/dL — ABNORMAL HIGH (ref 70–99)
Potassium: 4.1 mmol/L (ref 3.5–5.1)
Sodium: 140 mmol/L (ref 135–145)

## 2021-05-14 LAB — CBC WITH DIFFERENTIAL/PLATELET
Abs Immature Granulocytes: 0.13 10*3/uL — ABNORMAL HIGH (ref 0.00–0.07)
Basophils Absolute: 0 10*3/uL (ref 0.0–0.1)
Basophils Relative: 0 %
Eosinophils Absolute: 0 10*3/uL (ref 0.0–0.5)
Eosinophils Relative: 0 %
HCT: 36.1 % — ABNORMAL LOW (ref 39.0–52.0)
Hemoglobin: 11.7 g/dL — ABNORMAL LOW (ref 13.0–17.0)
Immature Granulocytes: 1 %
Lymphocytes Relative: 10 %
Lymphs Abs: 1.4 10*3/uL (ref 0.7–4.0)
MCH: 31.1 pg (ref 26.0–34.0)
MCHC: 32.4 g/dL (ref 30.0–36.0)
MCV: 96 fL (ref 80.0–100.0)
Monocytes Absolute: 0.8 10*3/uL (ref 0.1–1.0)
Monocytes Relative: 5 %
Neutro Abs: 12.5 10*3/uL — ABNORMAL HIGH (ref 1.7–7.7)
Neutrophils Relative %: 84 %
Platelets: 377 10*3/uL (ref 150–400)
RBC: 3.76 MIL/uL — ABNORMAL LOW (ref 4.22–5.81)
RDW: 13 % (ref 11.5–15.5)
WBC: 14.9 10*3/uL — ABNORMAL HIGH (ref 4.0–10.5)
nRBC: 0 % (ref 0.0–0.2)

## 2021-05-14 NOTE — ED Provider Notes (Signed)
Emergency Medicine Provider Triage Evaluation Note  Devin Hunter , a 87 y.o. male  was evaluated in triage.  Pt complains of urinary retention.  Patient had a Foley catheter in place earlier last month due to urinary retention.  He went to urology last week and they thought that he likely had a UTI as his urine appeared cloudy.  They placed him on an antibiotic.  They saw him again today and remove the Foley catheter.  Family reports that they removed at around noon and he has not urinated since.  He has been complaining of some pain to the suprapubic area.  Family is unsure if he had a repeat urinalysis today to ensure he had cleared the infection.  They do report he has seemed more drowsy for the past week.  Review of Systems  Positive: + urinary retention, drowsiness Negative: - fevers, nausea, vomiting  Physical Exam  BP 121/83 (BP Location: Right Arm)   Pulse 92   Temp 98.6 F (37 C) (Oral)   Resp 18   SpO2 99%  Gen:   Awake, no distress   Resp:  Normal effort  MSK:   Moves extremities without difficulty  Other:  + suprapubic TTP  Medical Decision Making  Medically screening exam initiated at 9:24 PM.  Appropriate orders placed.  [name] was informed that the remainder of the evaluation will be completed by another provider, this initial triage assessment does not replace that evaluation, and the importance of remaining in the ED until their evaluation is complete.     Eustaquio Maize, PA-C 05/14/21 2125    Regan Lemming, MD 05/14/21 2256

## 2021-05-14 NOTE — ED Notes (Signed)
Pt placed back into waiting room.

## 2021-05-14 NOTE — ED Triage Notes (Signed)
Pt brought in by family, pt had foley catheter removed today around noon, has not urinated since. Bladder scan volume: 34ml

## 2021-05-15 NOTE — ED Notes (Signed)
Foley attached to leg bag

## 2021-05-15 NOTE — ED Provider Notes (Signed)
[facility] EMERGENCY DEPARTMENT Provider Note   CSN: [id number] Arrival date & time: 05/14/21  2054     History Chief Complaint  Patient presents with   Urinary Retention    Devin Hunter is a 87 y.o. male.  This is a 87 y.o. male with significant medical history as below, including HLD HTN who presents to the ED with complaint of urinary retention. On arrival bladder scan with >350 cc of urine.   Location:  suprapubic region Duration:  1 day Onset:  gradual Timing:  constant Description:  aching, pressure pain  Severity:  mild Exacerbating/Alleviating Factors:  none identified  Associated Symptoms:  suprapubic pain Pertinent Negatives:  no fevers, chills, n/v,   Context: pt with prior history of foley catheter, catheter was removed recently and pt unable to void following this. Worsening suprapubic pain a/w retention.   The history is provided by the patient and a relative (daughter). No language interpreter was used.      Past Medical History:  Diagnosis Date   CAD (coronary artery disease) 1996   CABG w/ LIMA-LAD, SVG-OM, and SVG-PDA   Hyperlipidemia LDL goal <70    Hypertension     Patient Active Problem List   Diagnosis Date Noted   Erectile dysfunction 05/15/2014   CAD (coronary artery disease) 06/02/2013   HTN (hypertension) 06/02/2013   Hyperlipidemia with target LDL less than 70 06/02/2013   PSA elevation 06/02/2013    Past Surgical History:  Procedure Laterality Date   CORONARY ARTERY BYPASS GRAFT  1996   LIMA-LAD, SVG-OM, and SVG-PDA       No family history on file.  Social History   Tobacco Use   Smoking status: Never   Smokeless tobacco: Never  Substance Use Topics   Alcohol use: No   Drug use: Not Currently    Home Medications Prior to Admission medications   Medication Sig Start Date End Date Taking? Authorizing Provider  aspirin 81 MG tablet Take 81 mg by mouth daily.    [provider]   carvedilol (COREG) 3.125 MG tablet TAKE 1 TABLET(3.125 MG) BY MOUTH TWICE DAILY 01/14/20   Barrett, Evelene Croon, PA-C  furosemide (LASIX) 20 MG tablet Take 1 tablet (20 mg total) by mouth every other day. PATIENT MUST SCHEDULE APPOINTMENT FOR FUTURE REFILLS. **2ND ATTEMPT** 12/04/20   Troy Sine, MD  latanoprost (XALATAN) 0.005 % ophthalmic solution 1 drop at bedtime. 12/08/20   [provider]  potassium chloride (KLOR-CON) 10 MEQ tablet TAKE 1 TABLET BY MOUTH EVERY OTHER DAY, TAKE WITH LASIX 11/03/20   Troy Sine, MD  timolol (TIMOPTIC) 0.5 % ophthalmic solution 1 drop every morning. 10/31/20   [provider]    Allergies    Patient has no known allergies.  Review of Systems   Review of Systems  Constitutional:  Negative for chills and fever.  HENT:  Negative for facial swelling and trouble swallowing.   Eyes:  Negative for photophobia and visual disturbance.  Respiratory:  Negative for cough and shortness of breath.   Cardiovascular:  Negative for chest pain and palpitations.  Gastrointestinal:  Positive for abdominal pain. Negative for nausea and vomiting.  Endocrine: Negative for polydipsia and polyuria.  Genitourinary:  Positive for difficulty urinating. Negative for hematuria.  Musculoskeletal:  Negative for gait problem and joint swelling.  Skin:  Negative for pallor and rash.  Neurological:  Negative for syncope and headaches.  Psychiatric/Behavioral:  Negative for agitation and confusion.  Physical Exam Updated Vital Signs BP (!) 142/70   Pulse 89   Temp 98.8 F (37.1 C) (Oral)   Resp 18   SpO2 100%   Physical Exam Vitals and nursing note reviewed.  Constitutional:      General: He is not in acute distress.    Appearance: He is well-developed.  HENT:     Head: Normocephalic and atraumatic.     Right Ear: External ear normal.     Left Ear: External ear normal.     Mouth/Throat:     Mouth: Mucous membranes are moist.  Eyes:     General: No  scleral icterus. Cardiovascular:     Rate and Rhythm: Normal rate and regular rhythm.     Pulses: Normal pulses.     Heart sounds: Normal heart sounds.  Pulmonary:     Effort: Pulmonary effort is normal. No respiratory distress.     Breath sounds: Normal breath sounds.  Abdominal:     General: Abdomen is flat.     Palpations: Abdomen is soft.     Tenderness: There is no abdominal tenderness.  Genitourinary:    Comments: Foley catheter in place, draining appropriately. Musculoskeletal:        General: Normal range of motion.     Cervical back: Normal range of motion.     Right lower leg: No edema.     Left lower leg: No edema.  Skin:    General: Skin is warm and dry.     Capillary Refill: Capillary refill takes less than 2 seconds.  Neurological:     Mental Status: He is alert and oriented to person, place, and time.  Psychiatric:        Mood and Affect: Mood normal.        Behavior: Behavior normal.    ED Results / Procedures / Treatments   Labs (all labs ordered are listed, but only abnormal results are displayed) Labs Reviewed  BASIC METABOLIC PANEL - Abnormal; Notable for the following components:      Result Value   Glucose, Bld 119 (*)    Creatinine, Ser 2.11 (*)    GFR, Estimated 28 (*)    All other components within normal limits  CBC WITH DIFFERENTIAL/PLATELET - Abnormal; Notable for the following components:   WBC 14.9 (*)    RBC 3.76 (*)    Hemoglobin 11.7 (*)    HCT 36.1 (*)    Neutro Abs 12.5 (*)    Abs Immature Granulocytes 0.13 (*)    All other components within normal limits  URINALYSIS, ROUTINE W REFLEX MICROSCOPIC - Abnormal; Notable for the following components:   APPearance CLOUDY (*)    Hgb urine dipstick MODERATE (*)    Protein, ur 100 (*)    Leukocytes,Ua MODERATE (*)    RBC / HPF >50 (*)    WBC, UA >50 (*)    Bacteria, UA FEW (*)    All other components within normal limits  URINE CULTURE    EKG None  Radiology No results  found.  Procedures Procedures   Medications Ordered in ED Medications - No data to display  ED Course  I have reviewed the triage vital signs and the nursing notes.  Pertinent labs & imaging results that were available during my care of the patient were reviewed by me and considered in my medical decision making (see chart for details).    MDM Rules/Calculators/A&P  CC: urine retention   This patient complains of above; this involves an extensive number of treatment options and is a complaint that carries with it a high risk of complications and morbidity. Vital signs were reviewed. Serious etiologies considered.  Foley catheter was placed while patient was waiting for a room, this was prior to my assessment. Upon my assessment pt reports he is feeling back to his baseline, has no discomfort, catheter is draining appropriately. The urine is very cloudy.   Record review:  Previous records obtained and reviewed   Additional history obtained from daughter  Work up as above, notable for:  Lab results that were available during my care of the patient were reviewed by me and considered in my medical decision making.   Urinalysis with blood/wbc/ few bacteria present. No nitrites. Family reports pt is already on antibiotics for UTI. Will send urine culture.He has mild elevation to his WBC but consistent with is apparent baseline. Renal function is similar to his baseline.   He has no fever, hr stable, doubt sepsis. Recommend he follow up with urology in the next few days. Continue taking his oral abx.   Pt with enlarged prostate on recent CT, favor this as likely etiology of urinary retention.   D/w family at length regarding follow up and return precautions.  The patient improved significantly and was discharged in stable condition. Detailed discussions were had with the patient regarding current findings, and need for close f/u with PCP or on call doctor. The  patient has been instructed to return immediately if the symptoms worsen in any way for re-evaluation. Patient verbalized understanding and is in agreement with current care plan. All questions answered prior to discharge.       This chart was dictated using voice recognition software.  Despite best efforts to proofread,  errors can occur which can change the documentation meaning.  Final Clinical Impression(s) / ED Diagnoses Final diagnoses:  Urinary retention  Foley catheter present    Rx / DC Orders ED Discharge Orders     None        Jeanell Sparrow, DO 05/15/21 1019

## 2021-05-24 ENCOUNTER — Other Ambulatory Visit: Payer: Self-pay | Admitting: Cardiovascular Disease

## 2021-05-24 ENCOUNTER — Telehealth: Payer: Self-pay | Admitting: Adult Health

## 2021-05-24 NOTE — Telephone Encounter (Signed)
Left message for patient to call back and schedule Medicare Annual Wellness Visit (AWV) either virtually or in office. Left  my jabber number [phone]    awvi 07/04/09 per [facility]  please schedule at anytime with [facility] Nurse Health Advisor 1 or 2   This should be a 45 minute visit.  

## 2021-05-26 ENCOUNTER — Encounter (HOSPITAL_COMMUNITY): Payer: Self-pay | Admitting: Emergency Medicine

## 2021-05-26 ENCOUNTER — Emergency Department (HOSPITAL_COMMUNITY)
Admission: EM | Admit: 2021-05-26 | Discharge: 2021-05-26 | Disposition: A | Discharge status: Home / Self Care | Payer: Medicare Other | Attending: Emergency Medicine | Admitting: Emergency Medicine

## 2021-05-26 ENCOUNTER — Other Ambulatory Visit: Payer: Self-pay

## 2021-05-26 DIAGNOSIS — Y733 Surgical instruments, materials and gastroenterology and urology devices (including sutures) associated with adverse incidents: Secondary | ICD-10-CM | POA: Diagnosis not present

## 2021-05-26 DIAGNOSIS — I119 Hypertensive heart disease without heart failure: Secondary | ICD-10-CM | POA: Insufficient documentation

## 2021-05-26 DIAGNOSIS — R339 Retention of urine, unspecified: Secondary | ICD-10-CM | POA: Insufficient documentation

## 2021-05-26 DIAGNOSIS — Z951 Presence of aortocoronary bypass graft: Secondary | ICD-10-CM | POA: Insufficient documentation

## 2021-05-26 DIAGNOSIS — T839XXA Unspecified complication of genitourinary prosthetic device, implant and graft, initial encounter: Secondary | ICD-10-CM

## 2021-05-26 DIAGNOSIS — I2510 Atherosclerotic heart disease of native coronary artery without angina pectoris: Secondary | ICD-10-CM | POA: Diagnosis not present

## 2021-05-26 DIAGNOSIS — T83091A Other mechanical complication of indwelling urethral catheter, initial encounter: Secondary | ICD-10-CM | POA: Diagnosis not present

## 2021-05-26 DIAGNOSIS — Z7982 Long term (current) use of aspirin: Secondary | ICD-10-CM | POA: Diagnosis not present

## 2021-05-26 NOTE — ED Notes (Signed)
Foley catheter noted with blood and appears to be malpositioned. Myself and NT Briahnna, repositioned catheter and flushed it with 10 cc of normal saline. Blood urine at first not clear. Old leg bag removed, stat-lock in place with standard bag. Family member remained at the bedside and given instructions. Post void residual 400. Pt is now resting quietly without pain. EDP notified.

## 2021-05-26 NOTE — ED Triage Notes (Signed)
Patient presents with son having catheter issues. Concerned that patient may have pulled the catheter some and now it is not working. Hx of urinary retention. Per son patient is at baseline.

## 2021-05-26 NOTE — ED Provider Notes (Signed)
[facility] EMERGENCY DEPARTMENT Provider Note   CSN: [id number] Arrival date & time: 05/26/21  1055     History Chief Complaint  Patient presents with   Urinary Retention    Devin Hunter is a 87 y.o. male.  HPI  87 year old male with past medical history of CAD status post CABG, HTN, HLD presents the emergency department concern for Foley catheter dysfunction.  Patient reportedly had a Foley catheter placed for urinary retention.  He is pending outpatient urology follow-up.  Per the son who is his primary caregiver the patient this morning had tugged on the catheter and it did not seem to be draining appropriately and there was some blood in the line.  Patient had some mild suprapubic discomfort but denied any other acute symptoms.  No recent fever or other illness.  Past Medical History:  Diagnosis Date   CAD (coronary artery disease) 1996   CABG w/ LIMA-LAD, SVG-OM, and SVG-PDA   Hyperlipidemia LDL goal <70    Hypertension     Patient Active Problem List   Diagnosis Date Noted   Erectile dysfunction 05/15/2014   CAD (coronary artery disease) 06/02/2013   HTN (hypertension) 06/02/2013   Hyperlipidemia with target LDL less than 70 06/02/2013   PSA elevation 06/02/2013    Past Surgical History:  Procedure Laterality Date   CORONARY ARTERY BYPASS GRAFT  1996   LIMA-LAD, SVG-OM, and SVG-PDA       No family history on file.  Social History   Tobacco Use   Smoking status: Never   Smokeless tobacco: Never  Substance Use Topics   Alcohol use: No   Drug use: Not Currently    Home Medications Prior to Admission medications   Medication Sig Start Date End Date Taking? Authorizing Provider  amoxicillin-clavulanate (AUGMENTIN) 500-125 MG tablet Take 1 tablet by mouth 2 (two) times daily. 05/04/21  Yes [provider]  aspirin 81 MG tablet Take 81 mg by mouth daily.   Yes [provider]  carvedilol (COREG) 3.125 MG tablet TAKE  1 TABLET(3.125 MG) BY MOUTH TWICE DAILY Patient taking differently: Take 3.125 mg by mouth 2 (two) times daily with a meal. 01/14/20  Yes Barrett, Evelene Croon, PA-C  doxycycline (VIBRA-TABS) 100 MG tablet Take 100 mg by mouth 2 (two) times daily. 05/10/21  Yes [provider]  furosemide (LASIX) 20 MG tablet Take 1 tablet (20 mg total) by mouth every other day. PATIENT MUST SCHEDULE APPOINTMENT FOR FUTURE REFILLS. **2ND ATTEMPT** 12/04/20  Yes Troy Sine, MD  latanoprost (XALATAN) 0.005 % ophthalmic solution Place 1 drop into both eyes at bedtime. 12/08/20  Yes [provider]  potassium chloride (KLOR-CON) 10 MEQ tablet TAKE 1 TABLET BY MOUTH EVERY OTHER DAY, TAKE WITH LASIX Patient taking differently: 10 mEq See admin instructions. TAKE 1 TABLET BY MOUTH EVERY OTHER DAY, TAKE WITH LASIX 11/03/20  Yes Troy Sine, MD  timolol (TIMOPTIC) 0.5 % ophthalmic solution Place 1 drop into both eyes every morning. 10/31/20  Yes [provider]  silodosin (RAPAFLO) 4 MG CAPS capsule Take 4 mg by mouth at bedtime. 05/04/21   [provider]    Allergies    Patient has no known allergies.  Review of Systems   Review of Systems  Constitutional:  Negative for chills and fever.  HENT:  Negative for congestion.   Respiratory:  Negative for shortness of breath.   Cardiovascular:  Negative for chest pain.  Gastrointestinal:  Negative for abdominal  pain.  Genitourinary:        Foley complication  Neurological:  Negative for headaches.   Physical Exam Updated Vital Signs BP (!) 102/56   Pulse 65   Temp 98.2 F (36.8 C)   Resp (!) 22   SpO2 95%   Physical Exam Vitals and nursing note reviewed.  Constitutional:      Appearance: Normal appearance.  HENT:     Head: Normocephalic.     Mouth/Throat:     Mouth: Mucous membranes are moist.  Cardiovascular:     Rate and Rhythm: Normal rate.  Pulmonary:     Effort: Pulmonary effort is normal. No respiratory distress.   Abdominal:     Palpations: Abdomen is soft.     Tenderness: There is no abdominal tenderness.  Genitourinary:    Comments: Foley catheter in place, draining urine, no suprapubic TTP Skin:    General: Skin is warm.  Neurological:     Mental Status: He is alert and oriented to person, place, and time. Mental status is at baseline.  Psychiatric:        Mood and Affect: Mood normal.    ED Results / Procedures / Treatments   Labs (all labs ordered are listed, but only abnormal results are displayed) Labs Reviewed - No data to display  EKG None  Radiology No results found.  Procedures Procedures   Medications Ordered in ED Medications - No data to display  ED Course  I have reviewed the triage vital signs and the nursing notes.  Pertinent labs & imaging results that were available during my care of the patient were reviewed by me and considered in my medical decision making (see chart for details).    MDM Rules/Calculators/A&P                           87 year old male presents emergency department accompanied by son who is his primary caregiver for concern of Foley catheter complication.  This morning the patient had tugged on the Foley catheter and per the son it seemed to not be draining as freely as before.  The patient experienced a mild suprapubic pain.  Otherwise no acute changes in his health.  Stable on arrival.  Prior to my arrival to the room the nurse was able to flush and reposition the Foley without any difficulty.  There was initially some hematuria but this cleared up.  On my evaluation the Foley is in place, draining yellow urine, there is no suprapubic tenderness.  Patient feels back to baseline and offers no new complaints.  Plan for outpatient urology follow-up as previously recommended.  No signs of urinary retention/failure, no need for emergent labs or further evaluation.  Patient at this time appears safe and stable for discharge and will be treated as an  outpatient.  Discharge plan and strict return to ED precautions discussed, patient verbalizes understanding and agreement.  Final Clinical Impression(s) / ED Diagnoses Final diagnoses:  Problem with Foley catheter, initial encounter [facility])    Rx / DC Orders ED Discharge Orders     None        Lorelle Gibbs, DO 05/26/21 1357

## 2021-05-26 NOTE — Discharge Instructions (Signed)
You have been seen and discharged from the emergency department.  Keep Foley in place.  Follow-up with your urology appointment and primary provider for reevaluation and further care. Take home medications as prescribed. If you have any worsening symptoms or further concerns for your health please return to an emergency department for further evaluation.

## 2021-06-01 ENCOUNTER — Telehealth: Payer: Self-pay | Admitting: Cardiovascular Disease

## 2021-06-01 NOTE — Telephone Encounter (Signed)
*  STAT* If patient is at the pharmacy, call can be transferred to refill team.   1. Which medications need to be refilled? (please list name of each medication and dose if known)  carvedilol (COREG) 3.125 MG tablet furosemide (LASIX) 20 MG tablet potassium chloride (KLOR-CON) 10 MEQ tablet  2. Which pharmacy/location (including street and city if local pharmacy) is medication to be sent to? [place], [place] - [address] AT [address]  3. Do they need a 30 day or 90 day supply? 30 with refills  Patient is out of medication  Patient is scheduled to see Dr. Claiborne Billings 07/08/21. He will not have enough medication to last until his appt

## 2021-06-02 NOTE — Telephone Encounter (Signed)
Patient's daughter called to check on status.

## 2021-06-02 NOTE — Telephone Encounter (Signed)
Spoke with granddaughter about patient's request for refills on lasix and potassium. Explained to her that because it has been over 2 years since Dr. Claiborne Billings has seen patient; he would need to be seen for medication orders. Blood work from his ED visit are in the chart. There is an opening with Dr. Claiborne Billings at 8 am tomorrow, which she accepted.

## 2021-06-03 ENCOUNTER — Ambulatory Visit: Payer: Medicare Other | Admitting: Cardiovascular Disease

## 2021-06-03 ENCOUNTER — Other Ambulatory Visit: Payer: Self-pay

## 2021-06-03 DIAGNOSIS — Z951 Presence of aortocoronary bypass graft: Secondary | ICD-10-CM

## 2021-06-03 DIAGNOSIS — R339 Retention of urine, unspecified: Secondary | ICD-10-CM

## 2021-06-03 DIAGNOSIS — I2510 Atherosclerotic heart disease of native coronary artery without angina pectoris: Secondary | ICD-10-CM

## 2021-06-03 DIAGNOSIS — E785 Hyperlipidemia, unspecified: Secondary | ICD-10-CM

## 2021-06-03 DIAGNOSIS — N184 Chronic kidney disease, stage 4 (severe): Secondary | ICD-10-CM

## 2021-06-03 DIAGNOSIS — H9190 Unspecified hearing loss, unspecified ear: Secondary | ICD-10-CM

## 2021-06-03 MED ORDER — CARVEDILOL 3.125 MG PO TABS: 3.1250 mg | ORAL_TABLET | Freq: Two times a day (BID) | ORAL | 3 refills | Status: AC

## 2021-06-03 MED ORDER — FUROSEMIDE 20 MG PO TABS: 20.0000 mg | ORAL_TABLET | ORAL | 2 refills | Status: DC | PRN

## 2021-06-03 NOTE — Patient Instructions (Signed)
Medication Instructions:  Take Furosemide as needed, only take potassium when you take Furosemide. The current medical regimen is effective;  continue present plan and medications.  *If you need a refill on your cardiac medications before your next appointment, please call your pharmacy*   Lab Work: CMET, CBC, TSH, LIPID today   If you have labs (blood work) drawn today and your tests are completely normal, you will receive your results only by: [place] (if you have MyChart) OR A paper copy in the mail If you have any lab test that is abnormal or we need to change your treatment, we will call you to review the results.   Follow-Up: At [facility], you and your health needs are our priority.  As part of our continuing mission to provide you with exceptional heart care, we have created designated Provider Care Teams.  These Care Teams include your primary Cardiologist (physician) and Advanced Practice Providers (APPs -  Physician Assistants and Nurse Practitioners) who all work together to provide you with the care you need, when you need it.  We recommend signing up for the patient portal called "MyChart".  Sign up information is provided on this After Visit Summary.  MyChart is used to connect with patients for Virtual Visits (Telemedicine).  Patients are able to view lab/test results, encounter notes, upcoming appointments, etc.  Non-urgent messages can be sent to your provider as well.   To learn more about what you can do with MyChart, go to [email or web address].    Your next appointment:   6 month(s)  The format for your next appointment:   In Person  Provider:   Shelva Majestic, MD

## 2021-06-03 NOTE — Progress Notes (Signed)
Cardiology Office Note    Date:  06/13/2021   ID:  Devin Hunter, DOB 06-15-1920, MRN [id number]  PCP:  Dorothyann Peng, NP  Cardiologist:  Shelva Majestic, MD   Chief Complaint  Patient presents with   Establish Care    History of Present Illness:  Devin Hunter is a 87 y.o. male who I last saw in November 2015.  He presents to the office today to reestablish cardiology care.  Devin Hunter has a history of hypertension, hyperlipidemia, and coronary artery disease. He underwent CABG revascularization surgery in 1996 (LIMA placed to his LAD, SVG to the OM, and SVG to the PDA). Preoperative ejection fraction was 45%. His last nuclear perfusion study was in April 2011 which revealed normal perfusion. His last echo Doppler study was April 2013 showed mild concentric LVH with grade 1 diastolic dysfunction and normal LV function with ejection fraction greater than 55%. He had mild mitral annular calcification, moderate tricuspid regurgitation, mild aortic valve sclerosis and mild pulmonary hypertension with an estimated RV systolic pressure of 40 mm.   When I last saw him in November 2015 he continued to be very active.  He was walking at least 2 miles per day at least 3 days/week.  The previous year he was still climbing up on his roof and cleaning trees off his roof.  He denied any change in exercise capacity or recurrent chest pain.   He had undergone an echo Doppler study on August 25, 2017 after he had seen Rosaria Ferries, PA-C.  LV function was low normal with EF of 50 to 55%.  There was hypokinesis of the anteroseptal and inferoseptal wall.  There was grade 1 diastolic dysfunction.  There was mild aortic regurgitation without stenosis.  There was mild dilation of his left atrium.  His right atrium was severely dilated.  Pulmonary artery systolic pressure was mildly increased at 47 mm.   Since I last saw him, he has declined with reference to his physical capacity.  He is able to walk but  walks much less than previously.  He has had difficulty with hearing.  He has had prostate issues for which he continues to be evaluated by Dr. Diona Fanti.  He has had issues with urinary retention and has required insertion of a Foley.    He denies any chest tightness and is unaware of any palpitations, presyncope or syncope.  He had been evaluated in the [facility] emergency room on May 26, 2021 with Foley catheter dysfunction. He presents to reestablish cardiology care and medication assessment.   Past Medical History:  Diagnosis Date   CAD (coronary artery disease) 1996   CABG w/ LIMA-LAD, SVG-OM, and SVG-PDA   Hyperlipidemia LDL goal <70    Hypertension     Past Surgical History:  Procedure Laterality Date   CORONARY ARTERY BYPASS GRAFT  1996   LIMA-LAD, SVG-OM, and SVG-PDA    Current Medications: Outpatient Medications Prior to Visit  Medication Sig Dispense Refill   aspirin 81 MG tablet Take 81 mg by mouth daily.     doxycycline (VIBRA-TABS) 100 MG tablet Take 100 mg by mouth 2 (two) times daily.     latanoprost (XALATAN) 0.005 % ophthalmic solution Place 1 drop into both eyes at bedtime.     potassium chloride (KLOR-CON) 10 MEQ tablet TAKE 1 TABLET BY MOUTH EVERY OTHER DAY, TAKE WITH LASIX (Patient taking differently: 10 mEq See admin instructions. TAKE 1 TABLET BY MOUTH EVERY OTHER DAY, TAKE WITH  LASIX) 45 tablet 3   timolol (TIMOPTIC) 0.5 % ophthalmic solution Place 1 drop into both eyes every morning.     amoxicillin-clavulanate (AUGMENTIN) 500-125 MG tablet Take 1 tablet by mouth 2 (two) times daily.     carvedilol (COREG) 3.125 MG tablet TAKE 1 TABLET(3.125 MG) BY MOUTH TWICE DAILY (Patient taking differently: Take 3.125 mg by mouth 2 (two) times daily with a meal.) 180 tablet 3   furosemide (LASIX) 20 MG tablet Take 1 tablet (20 mg total) by mouth every other day. PATIENT MUST SCHEDULE APPOINTMENT FOR FUTURE REFILLS. **2ND ATTEMPT** 15 tablet 0   silodosin (RAPAFLO) 4  MG CAPS capsule Take 4 mg by mouth at bedtime. (Patient not taking: Reported on 06/03/2021)     No facility-administered medications prior to visit.     Allergies:   Patient has no known allergies.   Social History   Socioeconomic History   Marital status: Widowed    Spouse name: Not on file   Number of children: Not on file   Years of education: Not on file   Highest education level: Not on file  Occupational History   Not on file  Tobacco Use   Smoking status: Never   Smokeless tobacco: Never  Substance and Sexual Activity   Alcohol use: No   Drug use: Not Currently   Sexual activity: Not on file  Other Topics Concern   Not on file  Social History Narrative   Retired    Investment banker, operational of Radio broadcast assistant Strain: Not on file  Food Insecurity: Not on file  Transportation Needs: Not on file  Physical Activity: Not on file  Stress: Not on file  Social Connections: Not on file    Social he is without he has 5 children, 20 grandchildren and 7 great-grandchildren.  There is no tobacco history.  Family History: His parents are deceased.  ROS General: Negative; No fevers, chills, or night sweats;  HEENT: Difficulty with hearing; no sinus congestion, difficulty swallowing Pulmonary: Negative; No cough, wheezing, shortness of breath, hemoptysis Cardiovascular: See HPI GI: Negative; No nausea, vomiting, diarrhea, or abdominal pain GU: Large prostate with urinary retention and need for Foley catheterization, followed by Dr. Diona Fanti Musculoskeletal: Negative; no myalgias, joint pain, or weakness Hematologic/Oncology: Negative; no easy bruising, bleeding Endocrine: Negative; no heat/cold intolerance; no diabetes Neuro: Negative; no changes in balance, headaches Skin: Negative; No rashes or skin lesions Psychiatric: Negative; No behavioral problems, depression Sleep: Negative; No snoring, daytime sleepiness, hypersomnolence, bruxism, restless socially he is  widowed, hypnogognic hallucinations, no cataplexy Other comprehensive 14 point system review is negative.   PHYSICAL EXAM:   VS:  BP 122/66   Pulse 64   Ht 5' 8" (1.727 m)   Wt 127 lb 9.6 oz (57.9 kg)   BMI 19.40 kg/m     Repeat blood pressure by me was 112/68  Wt Readings from Last 3 Encounters:  06/03/21 127 lb 9.6 oz (57.9 kg)  04/22/21 131 lb (59.4 kg)  01/19/21 130 lb (59 kg)    General: Alert, oriented, no distress.  Skin: normal turgor, no rashes, warm and dry HEENT: Normocephalic, atraumatic. Pupils equal round and reactive to light; sclera anicteric; extraocular muscles intact;  Nose without nasal septal hypertrophy Mouth/Parynx benign; Mallinpatti scale 3 Neck: No JVD, no carotid bruits; normal carotid upstroke Lungs: clear to ausculatation and percussion; no wheezing or rales Chest wall: without tenderness to palpitation Heart: PMI not displaced, RRR, s1 s2 normal, 1/6 systolic murmur,  no diastolic murmur, no rubs, gallops, thrills, or heaves Abdomen: soft, nontender; no hepatosplenomehaly, BS+; abdominal aorta nontender and not dilated by palpation. Back: no CVA tenderness Pulses 2+ Musculoskeletal: full range of motion, normal strength, no joint deformities Extremities: no clubbing cyanosis or edema, Homan's sign negative  Neurologic: grossly nonfocal; Cranial nerves grossly wnl Psychologic: Normal mood and affect   Studies/Labs Reviewed:   EKG:  EKG is ordered today.  ECG (independently read by me):  NSR at 63, No STT changes, no ectopy, normal intervals  May 15, 2014 ECG (independently read by me): Normal sinus rhythm at 61 bpm; no ectopy.  QTc interval 378 ms.  PR interval 170 ms   October 2014  ECG: Normal sinus rhythm with mild sinus arrhythmia. Normal intervals.  Recent Labs: BMP Latest Ref Rng & Units 06/03/2021 05/14/2021 04/14/2021  Glucose 70 - 99 mg/dL 87 119(H) 145(H)  BUN 10 - 36 mg/dL _0 Creatinine 0.76 - 1.27 mg/dL 1.69(H)  2.11(H) 2.21(H)  BUN/Creat Ratio 10 - 24 10 - -  Sodium 134 - 144 mmol/L 145(H) 140 142  Potassium 3.5 - 5.2 mmol/L 4.5 4.1 4.6  Chloride 96 - 106 mmol/L 107(H) 105 106  CO2 20 - 29 mmol/L _1 Calcium 8.6 - 10.2 mg/dL 9.6 9.6 10.6(H)     Hepatic Function Latest Ref Rng & Units 06/03/2021 04/14/2021 08/21/2017  Total Protein 6.0 - 8.5 g/dL 6.0 6.8 6.9  Albumin 3.5 - 4.6 g/dL 3.7 4.0 4.0  AST 0 - 40 IU/L 16 32 13  ALT 0 - 44 IU/L _2 Alk Phosphatase 44 - 121 IU/L 79 80 87  Total Bilirubin 0.0 - 1.2 mg/dL 0.3 1.0 0.3    CBC Latest Ref Rng & Units 06/03/2021 05/14/2021 04/14/2021  WBC 3.4 - 10.8 x10E3/uL 9.0 14.9(H) 14.6(H)  Hemoglobin 13.0 - 17.7 g/dL 11.7(L) 11.7(L) 13.1  Hematocrit 37.5 - 51.0 % 35.5(L) 36.1(L) 40.7  Platelets 150 - 450 x10E3/uL 246 377 262   Lab Results  Component Value Date   MCV 95 06/03/2021   MCV 96.0 05/14/2021   MCV 97.6 04/14/2021   Lab Results  Component Value Date   TSH 2.210 06/03/2021   No results found for: HGBA1C   BNP    Component Value Date/Time   BNP 296.3 (H) 12/22/2020 1339    ProBNP No results found for: PROBNP   Lipid Panel     Component Value Date/Time   CHOL 152 06/03/2021 0923   TRIG 86 06/03/2021 0923   HDL 32 (L) 06/03/2021 0923   CHOLHDL 4.8 06/03/2021 0923   CHOLHDL 3.0 12/07/2015 1128   VLDL 14 12/07/2015 1128   LDLCALC 104 (H) 06/03/2021 0923   LABVLDL 16 06/03/2021 0923     RADIOLOGY: No results found.   Additional studies/ records that were reviewed today include:   ECHO: 08/25/2017 Study Conclusions   - Left ventricle: The cavity size was normal. Systolic function was    normal. The estimated ejection fraction was in the range of 50%    to 55%. Hypokinesis of the anteroseptal and inferoseptal    myocardium. Doppler parameters are consistent with abnormal left    ventricular relaxation (grade 1 diastolic dysfunction).  - Aortic valve: Valve mobility was restricted. Transvalvular     velocity was within the normal range. There was no stenosis.    There was mild regurgitation. Valve area (VTI): 1.78 cm^2. Valve    area (Vmax): 1.76 cm^2. Valve  area (Vmean): 1.57 cm^2.    Regurgitation pressure half-time: 668 ms.  - Mitral valve: Transvalvular velocity was within the normal range.    There was no evidence for stenosis. There was trivial    regurgitation.  - Left atrium: The atrium was mildly dilated.  - Right ventricle: The cavity size was normal. Wall thickness was    normal. Systolic function was normal.  - Right atrium: The atrium was severely dilated.  - Tricuspid valve: There was mild-moderate regurgitation.  - Pulmonary arteries: Systolic pressure was mildly increased. PA    peak pressure: 47 mm Hg (S).    ASSESSMENT:    1. CAD in native artery   2. Hyperlipidemia with target LDL less than 70   3. Hx of coronary artery bypass surgery   4. Urinary retention   5. CKD (chronic kidney disease) stage 4, GFR 15-29 ml/min (HCC)   6. Hearing difficulty, unspecified laterality     PLAN:  Mr. Randale Carvalho is a 87 year old gentleman who has a history of hypertension, hyperlipidemia, and CAD.  In 1996 he underwent CABG revascularization surgery with a LIMA to his LAD, SVG to OM, and SVG to PDA.  His preoperative ejection fraction was 45%.  A nuclear perfusion study in April 2011 showed normal perfusion.  He has been documented to have mild concentric LVH with grade 1 diastolic dysfunction on echo in 2013.  More recently an echo in February 2019 showed EF estimated at 50 to 55% with wall motion abnormality and mild hypocontractility involving the anteroseptal inferoseptal wall.  There was grade 1 diastolic dysfunction.  There was evidence for mild left atrial and more severe right atrial enlargement and he had mild increased pulmonary systolic pressure at 47 mm.  Presently, he has continued to be on aspirin 81 mg, carvedilol 3.125 mg twice a day and he has been taking  furosemide 20 mg every other day.  On exam, he is euvolemic.  His blood pressure by me was 112/68.  There is no edema.  He recently has had issues with urinary retention and has required Foley catheter insertion and is followed closely by Dr. Franchot Gallo.  He is still able to ambulate without anginal symptomatology.  His activity is significantly diminished compared to when last seen by me in 2015.  He has not had recent laboratory and I have recommended fasting labs to be obtained with a comprehensive metabolic panel, CBC, TSH and lipid studies.  I reviewed recent laboratory from his emergency room evaluation and creatinine on May 14, 2021 was 2.11.  I have recommended he change his furosemide from 20 mg every other day to just as needed.  He will also discontinue his potassium and only take this if he requires furosemide.  His ECG today remained stable on carvedilol 3.125 mg twice a day and shows sinus rhythm at 63 bpm with mild ST-T changes.  I have reviewed his medications.  I will contact him regarding his laboratory.    Medication Adjustments/Labs and Tests Ordered: Current medicines are reviewed at length with the patient today.  Concerns regarding medicines are outlined above.  Medication changes, Labs and Tests ordered today are listed in the Patient Instructions below. Patient Instructions  Medication Instructions:  Take Furosemide as needed, only take potassium when you take Furosemide. The current medical regimen is effective;  continue present plan and medications.  *If you need a refill on your cardiac medications before your next appointment, please call your pharmacy*   Lab  Work: CMET, CBC, TSH, LIPID today   If you have labs (blood work) drawn today and your tests are completely normal, you will receive your results only by: [place] (if you have MyChart) OR A paper copy in the mail If you have any lab test that is abnormal or we need to change your treatment, we  will call you to review the results.   Follow-Up: At [facility], you and your health needs are our priority.  As part of our continuing mission to provide you with exceptional heart care, we have created designated Provider Care Teams.  These Care Teams include your primary Cardiologist (physician) and Advanced Practice Providers (APPs -  Physician Assistants and Nurse Practitioners) who all work together to provide you with the care you need, when you need it.  We recommend signing up for the patient portal called "MyChart".  Sign up information is provided on this After Visit Summary.  MyChart is used to connect with patients for Virtual Visits (Telemedicine).  Patients are able to view lab/test results, encounter notes, upcoming appointments, etc.  Non-urgent messages can be sent to your provider as well.   To learn more about what you can do with MyChart, go to [email or web address].    Your next appointment:   6 month(s)  The format for your next appointment:   In Person  Provider:   Shelva Majestic, MD   Signed, Shelva Majestic, MD  06/13/2021 11:44 AM    [facility] [address], [place], [place], [place]  [address] Phone: [phone]

## 2021-06-04 LAB — COMPREHENSIVE METABOLIC PANEL
ALT: 6 IU/L (ref 0–44)
AST: 16 IU/L (ref 0–40)
Albumin/Globulin Ratio: 1.6 (ref 1.2–2.2)
Albumin: 3.7 g/dL (ref 3.5–4.6)
Alkaline Phosphatase: 79 IU/L (ref 44–121)
BUN/Creatinine Ratio: 10 (ref 10–24)
BUN: 17 mg/dL (ref 10–36)
Bilirubin Total: 0.3 mg/dL (ref 0.0–1.2)
CO2: 25 mmol/L (ref 20–29)
Calcium: 9.6 mg/dL (ref 8.6–10.2)
Chloride: 107 mmol/L — ABNORMAL HIGH (ref 96–106)
Creatinine, Ser: 1.69 mg/dL — ABNORMAL HIGH (ref 0.76–1.27)
Globulin, Total: 2.3 g/dL (ref 1.5–4.5)
Glucose: 87 mg/dL (ref 70–99)
Potassium: 4.5 mmol/L (ref 3.5–5.2)
Sodium: 145 mmol/L — ABNORMAL HIGH (ref 134–144)
Total Protein: 6 g/dL (ref 6.0–8.5)
eGFR: 27 mL/min/{1.73_m2} — ABNORMAL LOW (ref >59)

## 2021-06-04 LAB — CBC
Hematocrit: 35.5 % — ABNORMAL LOW (ref 37.5–51.0)
Hemoglobin: 11.7 g/dL — ABNORMAL LOW (ref 13.0–17.7)
MCH: 31.3 pg (ref 26.6–33.0)
MCHC: 33 g/dL (ref 31.5–35.7)
MCV: 95 fL (ref 79–97)
Platelets: 246 10*3/uL (ref 150–450)
RBC: 3.74 x10E6/uL — ABNORMAL LOW (ref 4.14–5.80)
RDW: 12.5 % (ref 11.6–15.4)
WBC: 9 10*3/uL (ref 3.4–10.8)

## 2021-06-04 LAB — LIPID PANEL
Chol/HDL Ratio: 4.8 ratio (ref 0.0–5.0)
Cholesterol, Total: 152 mg/dL (ref 100–199)
HDL: 32 mg/dL — ABNORMAL LOW (ref >39)
LDL Chol Calc (NIH): 104 mg/dL — ABNORMAL HIGH (ref 0–99)
Triglycerides: 86 mg/dL (ref 0–149)
VLDL Cholesterol Cal: 16 mg/dL (ref 5–40)

## 2021-06-04 LAB — TSH: TSH: 2.21 u[IU]/mL (ref 0.450–4.500)

## 2021-06-07 NOTE — Telephone Encounter (Signed)
Patient seen in office - refills sent to pharmacy.

## 2021-06-13 ENCOUNTER — Encounter: Payer: Self-pay | Admitting: Cardiovascular Disease

## 2021-06-20 ENCOUNTER — Encounter (HOSPITAL_COMMUNITY): Payer: Self-pay | Admitting: *Deleted

## 2021-06-20 ENCOUNTER — Emergency Department (HOSPITAL_COMMUNITY)
Admission: EM | Admit: 2021-06-20 | Discharge: 2021-06-20 | Disposition: A | Discharge status: Home / Self Care | Payer: Medicare Other | Attending: Emergency Medicine | Admitting: Emergency Medicine

## 2021-06-20 ENCOUNTER — Other Ambulatory Visit: Payer: Self-pay

## 2021-06-20 DIAGNOSIS — I2510 Atherosclerotic heart disease of native coronary artery without angina pectoris: Secondary | ICD-10-CM | POA: Insufficient documentation

## 2021-06-20 DIAGNOSIS — I10 Essential (primary) hypertension: Secondary | ICD-10-CM | POA: Insufficient documentation

## 2021-06-20 DIAGNOSIS — Z79899 Other long term (current) drug therapy: Secondary | ICD-10-CM | POA: Insufficient documentation

## 2021-06-20 DIAGNOSIS — Z951 Presence of aortocoronary bypass graft: Secondary | ICD-10-CM | POA: Diagnosis not present

## 2021-06-20 DIAGNOSIS — Z7982 Long term (current) use of aspirin: Secondary | ICD-10-CM | POA: Insufficient documentation

## 2021-06-20 DIAGNOSIS — R319 Hematuria, unspecified: Secondary | ICD-10-CM | POA: Diagnosis present

## 2021-06-20 DIAGNOSIS — T83091A Other mechanical complication of indwelling urethral catheter, initial encounter: Secondary | ICD-10-CM | POA: Insufficient documentation

## 2021-06-20 DIAGNOSIS — T839XXA Unspecified complication of genitourinary prosthetic device, implant and graft, initial encounter: Secondary | ICD-10-CM

## 2021-06-20 NOTE — ED Triage Notes (Signed)
Pt daughter reports she thinks her father may have been pulling on his urinary catheter, has bloody drainage. Bloody drainage in his bag.

## 2021-06-20 NOTE — ED Provider Notes (Signed)
[facility] EMERGENCY DEPARTMENT Provider Note   CSN: [id number] Arrival date & time: 06/20/21  2033     History Chief Complaint  Patient presents with   Hematuria    Devin Hunter is a 87 y.o. male.  Pt's daughter reports pt has been pulling on foley and caused it to bleed  The history is provided by a relative. No language interpreter was used.  Hematuria This is a new problem. The current episode started 6 to 12 hours ago. The problem occurs constantly. The problem has been gradually worsening. Pertinent negatives include no chest pain. Nothing aggravates the symptoms. Nothing relieves the symptoms. He has tried nothing for the symptoms. The treatment provided no relief.      Past Medical History:  Diagnosis Date   CAD (coronary artery disease) 1996   CABG w/ LIMA-LAD, SVG-OM, and SVG-PDA   Hyperlipidemia LDL goal <70    Hypertension     Patient Active Problem List   Diagnosis Date Noted   Erectile dysfunction 05/15/2014   CAD (coronary artery disease) 06/02/2013   HTN (hypertension) 06/02/2013   Hyperlipidemia with target LDL less than 70 06/02/2013   PSA elevation 06/02/2013    Past Surgical History:  Procedure Laterality Date   CORONARY ARTERY BYPASS GRAFT  1996   LIMA-LAD, SVG-OM, and SVG-PDA       No family history on file.  Social History   Tobacco Use   Smoking status: Never   Smokeless tobacco: Never  Substance Use Topics   Alcohol use: No   Drug use: Not Currently    Home Medications Prior to Admission medications   Medication Sig Start Date End Date Taking? Authorizing Provider  aspirin 81 MG tablet Take 81 mg by mouth daily.    [provider]  carvedilol (COREG) 3.125 MG tablet Take 1 tablet (3.125 mg total) by mouth 2 (two) times daily with a meal. 06/03/21   Troy Sine, MD  doxycycline (VIBRA-TABS) 100 MG tablet Take 100 mg by mouth 2 (two) times daily. 05/10/21   [provider]  furosemide  (LASIX) 20 MG tablet Take 1 tablet (20 mg total) by mouth as needed. 06/03/21   Troy Sine, MD  latanoprost (XALATAN) 0.005 % ophthalmic solution Place 1 drop into both eyes at bedtime. 12/08/20   [provider]  potassium chloride (KLOR-CON) 10 MEQ tablet TAKE 1 TABLET BY MOUTH EVERY OTHER DAY, TAKE WITH LASIX Patient taking differently: 10 mEq See admin instructions. TAKE 1 TABLET BY MOUTH EVERY OTHER DAY, TAKE WITH LASIX 11/03/20   Troy Sine, MD  silodosin (RAPAFLO) 4 MG CAPS capsule Take 4 mg by mouth at bedtime. Patient not taking: Reported on 06/03/2021 05/04/21   [provider]  timolol (TIMOPTIC) 0.5 % ophthalmic solution Place 1 drop into both eyes every morning. 10/31/20   [provider]    Allergies    Patient has no known allergies.  Review of Systems   Review of Systems  Cardiovascular:  Negative for chest pain.  Genitourinary:  Positive for hematuria.  All other systems reviewed and are negative.  Physical Exam Updated Vital Signs BP 119/63    Pulse 81    Temp 98.3 F (36.8 C) (Oral)    Resp 15    SpO2 100%   Physical Exam Vitals reviewed.  Constitutional:      Appearance: Normal appearance.  Cardiovascular:     Rate and Rhythm: Normal rate.  Pulmonary:  Effort: Pulmonary effort is normal.  Skin:    General: Skin is warm.  Neurological:     General: No focal deficit present.     Mental Status: He is alert.  Psychiatric:        Mood and Affect: Mood normal.    ED Results / Procedures / Treatments   Labs (all labs ordered are listed, but only abnormal results are displayed) Labs Reviewed - No data to display  EKG None  Radiology No results found.  Procedures Procedures   Medications Ordered in ED Medications - No data to display  ED Course  I have reviewed the triage vital signs and the nursing notes.  Pertinent labs & imaging results that were available during my care of the patient were reviewed by me and  considered in my medical decision making (see chart for details).    MDM Rules/Calculators/A&P                         MDM:  Foley not draining,  new foley placed,  Pt reports he feels much better.  Pt had 500 of urine output.      Final Clinical Impression(s) / ED Diagnoses Final diagnoses:  Problem with Foley catheter, initial encounter [facility])    Rx / DC Orders ED Discharge Orders     None     An After Visit Summary was printed and given to the patient.    Fransico Meadow, PA-C 06/20/21 2241    [name], MD 06/20/21 2249

## 2021-06-20 NOTE — ED Provider Notes (Signed)
Emergency Medicine Provider Triage Evaluation Note  Devin Hunter , a 87 y.o. male  was evaluated in triage.  Pt complains of foley blockage.  Pt needs to pee but nothing coming out    Review of Systems  Positive: Blood in urine Negative: No fever   Physical Exam  BP 119/63   Pulse 81   Temp 98.3 F (36.8 C) (Oral)   Resp 15   SpO2 100%  Gen:   Awake,  Resp:  Normal effort  MSK:   Moves extremities without difficulty  Other:  Blood in foley bag  Medical Decision Making  Medically screening exam initiated at 9:43 PM.  Appropriate orders placed.  Devin Hunter was informed that the remainder of the evaluation will be completed by another provider, this initial triage assessment does not replace that evaluation, and the importance of remaining in the ED until their evaluation is complete.     Devin Hunter 06/20/21 2144    Devin Saver, MD 06/20/21 2159

## 2021-06-20 NOTE — ED Notes (Signed)
Drainage bag changed to leg bag. Pt leaving with his daughter

## 2021-06-20 NOTE — Discharge Instructions (Addendum)
Return if any problems.

## 2021-06-25 ENCOUNTER — Other Ambulatory Visit: Payer: Self-pay

## 2021-06-25 ENCOUNTER — Encounter (HOSPITAL_COMMUNITY): Payer: Self-pay | Admitting: Emergency Medicine

## 2021-06-25 ENCOUNTER — Emergency Department (HOSPITAL_COMMUNITY)
Admission: EM | Admit: 2021-06-25 | Discharge: 2021-06-25 | Disposition: A | Discharge status: Home / Self Care | Payer: Medicare Other | Attending: Emergency Medicine | Admitting: Emergency Medicine

## 2021-06-25 DIAGNOSIS — I2510 Atherosclerotic heart disease of native coronary artery without angina pectoris: Secondary | ICD-10-CM | POA: Insufficient documentation

## 2021-06-25 DIAGNOSIS — Z7982 Long term (current) use of aspirin: Secondary | ICD-10-CM | POA: Diagnosis not present

## 2021-06-25 DIAGNOSIS — Z951 Presence of aortocoronary bypass graft: Secondary | ICD-10-CM | POA: Diagnosis not present

## 2021-06-25 DIAGNOSIS — I10 Essential (primary) hypertension: Secondary | ICD-10-CM | POA: Diagnosis not present

## 2021-06-25 DIAGNOSIS — T83091A Other mechanical complication of indwelling urethral catheter, initial encounter: Secondary | ICD-10-CM | POA: Insufficient documentation

## 2021-06-25 DIAGNOSIS — Y846 Urinary catheterization as the cause of abnormal reaction of the patient, or of later complication, without mention of misadventure at the time of the procedure: Secondary | ICD-10-CM | POA: Insufficient documentation

## 2021-06-25 DIAGNOSIS — T839XXA Unspecified complication of genitourinary prosthetic device, implant and graft, initial encounter: Secondary | ICD-10-CM

## 2021-06-25 NOTE — ED Notes (Signed)
NT and RN were able to deflate balloon, advanced catheter and re-inflate balloon w/ urinary output. New leg bag attached w/ foley securing device. Pt and son educated on catheter care.

## 2021-06-25 NOTE — ED Triage Notes (Signed)
Pt here w/ son, thinks that pt has pulled out his urinary catheter. Per son, he empties leg bag twice a day, this morning it  was empty. Son is concerned that foley is either pulled too far out or is clogged.

## 2021-06-25 NOTE — Discharge Instructions (Signed)
Please return if you have pain with urination, fever, chills, nausea, vomiting, diarrhea or any further issues with your foley catheter

## 2021-06-25 NOTE — ED Provider Notes (Signed)
[name]   CSN: [id number] Arrival date & time: 06/25/21  1134     History Chief Complaint  Patient presents with   Foley catheter problem    Devin Hunter is a 87 y.o. male with a PMH significant for urinary retention with indwelling Foley catheter in place for the last 2 months who presents with a problem with his Foley catheter.  Patient presents with the son, son reports that his state he is 38 years old, and has some confusion he occasionally jostle's his Foley catheter absentmindedly, he woke this morning with some blood in his Foley bag, no further urine output.  They are here for evaluation of the Foley catheter.  Patient is denying any pain, fever, chills, denied any bloody urine prior to last night.  Patient denies any abdominal pain, nausea, vomiting.  HPI     Past Medical History:  Diagnosis Date   CAD (coronary artery disease) 1996   CABG w/ LIMA-LAD, SVG-OM, and SVG-PDA   Hyperlipidemia LDL goal <70    Hypertension     Patient Active Problem List   Diagnosis Date Noted   Erectile dysfunction 05/15/2014   CAD (coronary artery disease) 06/02/2013   HTN (hypertension) 06/02/2013   Hyperlipidemia with target LDL less than 70 06/02/2013   PSA elevation 06/02/2013    Past Surgical History:  Procedure Laterality Date   CORONARY ARTERY BYPASS GRAFT  1996   LIMA-LAD, SVG-OM, and SVG-PDA       History reviewed. No pertinent family history.  Social History   Tobacco Use   Smoking status: Never   Smokeless tobacco: Never  Substance Use Topics   Alcohol use: No   Drug use: Not Currently    Home Medications Prior to Admission medications   Medication Sig Start Date End Date Taking? Authorizing Provider  aspirin 81 MG tablet Take 81 mg by mouth daily.    [provider]  carvedilol (COREG) 3.125 MG tablet Take 1 tablet (3.125 mg total) by mouth 2 (two) times daily with a meal. 06/03/21    Troy Sine, MD  doxycycline (VIBRA-TABS) 100 MG tablet Take 100 mg by mouth 2 (two) times daily. 05/10/21   [provider]  furosemide (LASIX) 20 MG tablet Take 1 tablet (20 mg total) by mouth as needed. 06/03/21   Troy Sine, MD  latanoprost (XALATAN) 0.005 % ophthalmic solution Place 1 drop into both eyes at bedtime. 12/08/20   [provider]  potassium chloride (KLOR-CON) 10 MEQ tablet TAKE 1 TABLET BY MOUTH EVERY OTHER DAY, TAKE WITH LASIX Patient taking differently: 10 mEq See admin instructions. TAKE 1 TABLET BY MOUTH EVERY OTHER DAY, TAKE WITH LASIX 11/03/20   Troy Sine, MD  silodosin (RAPAFLO) 4 MG CAPS capsule Take 4 mg by mouth at bedtime. Patient not taking: Reported on 06/03/2021 05/04/21   [provider]  timolol (TIMOPTIC) 0.5 % ophthalmic solution Place 1 drop into both eyes every morning. 10/31/20   [provider]    Allergies    Patient has no known allergies.  Review of Systems   Review of Systems  Genitourinary:  Positive for decreased urine volume.  All other systems reviewed and are negative.  Physical Exam Updated Vital Signs BP 119/79 (BP Location: Right Arm)    Pulse 77    Temp 98.3 F (36.8 C) (Oral)    Resp 16    SpO2 100%   Physical Exam Vitals and  nursing note reviewed.  Constitutional:      General: He is not in acute distress.    Appearance: Normal appearance.  HENT:     Head: Normocephalic and atraumatic.  Eyes:     General:        Right eye: No discharge.        Left eye: No discharge.  Cardiovascular:     Rate and Rhythm: Normal rate and regular rhythm.  Pulmonary:     Effort: Pulmonary effort is normal. No respiratory distress.  Genitourinary:    Comments: Normal-appearing penis with well housed Foley catheter.  There is no suprapubic tenderness to palpation, no abdominal pain.  Normal-appearing scrotum, testicles. Musculoskeletal:        General: No deformity.  Skin:    General: Skin is warm  and dry.  Neurological:     Mental Status: He is alert and oriented to person, place, and time.  Psychiatric:        Mood and Affect: Mood normal.        Behavior: Behavior normal.    ED Results / Procedures / Treatments   Labs (all labs ordered are listed, but only abnormal results are displayed) Labs Reviewed - No data to display  EKG None  Radiology No results found.  Procedures Procedures   Medications Ordered in ED Medications - No data to display  ED Course  I have reviewed the triage vital signs and the nursing notes.  Pertinent labs & imaging results that were available during my care of the patient were reviewed by me and considered in my medical decision making (see chart for details).    MDM Rules/Calculators/A&P                         Overall well-appearing male presents with an issue with Foley catheter, based on report from his son high clinical suspicion that appropriate placement of the Foley catheter was displaced at some point in the night.  Nursing evaluated and flush Foley catheter, ensured good seal, good urine flow.  There is no hematuria, or blood in the bag after Foley was adjusted.  Patient denies any pain, fever at this time.  Patient declines further evaluation.  Patient discharged in stable condition, encouraged to follow-up if he has any pain with urination, fever, chills, new hematuria, nausea, vomiting. Final Clinical Impression(s) / ED Diagnoses Final diagnoses:  Problem with Foley catheter, initial encounter [name])    Rx / DC Orders ED Discharge Orders     None        Dorien Chihuahua 06/25/21 1427    Carmin Muskrat, MD 06/28/21 2011

## 2021-06-25 NOTE — ED Notes (Signed)
RN reviewed discharge instructions w/ pt's son. Follow up reviewed, no further questions

## 2021-07-08 ENCOUNTER — Ambulatory Visit: Payer: Medicare Other | Admitting: Cardiovascular Disease

## 2021-07-14 ENCOUNTER — Telehealth: Payer: Self-pay | Admitting: Adult Health

## 2021-07-14 NOTE — Telephone Encounter (Signed)
Left message for patient to call back and schedule Medicare Annual Wellness Visit (AWV) either virtually or in office. Left  my jabber number [phone]    awvi 07/04/09 per [facility]  please schedule at anytime with [facility] Nurse Health Advisor 1 or 2   This should be a 45 minute visit.  

## 2021-07-23 ENCOUNTER — Telehealth: Payer: Medicare Other | Admitting: Adult Health

## 2021-07-28 ENCOUNTER — Telehealth (INDEPENDENT_AMBULATORY_CARE_PROVIDER_SITE_OTHER): Payer: Medicare Other | Admitting: Adult Health

## 2021-07-28 DIAGNOSIS — R627 Adult failure to thrive: Secondary | ICD-10-CM

## 2021-07-28 NOTE — Progress Notes (Signed)
Virtual Visit via Telephone Note  I connected with Devin Hunter on 07/28/21 at 11:00 AM EST by telephone and verified that I am speaking with the correct person using two identifiers.   I discussed the limitations, risks, security and privacy concerns of performing an evaluation and management service by telephone and the availability of in person appointments. I also discussed with the patient that there may be a patient responsible charge related to this service. The patient expressed understanding and agreed to proceed.  Location patient: home Location provider: work or home office Participants present for the call: patient, provider, son and daughter  Patient did not have a visit in the prior 7 days to address this/these issue(s).   History of Present Illness: 88 year old male who  has a past medical history of CAD (coronary artery disease) (1996), Hyperlipidemia LDL goal <70, and Hypertension.  Spoke to the patients daughter Devin Hunter) and his Son Devin Hunter), family would like to see if they can get home health care nurse to come in and help her father out with his ADLs such as bathing and feeding.  They report that they are having trouble caring for him adequately.  He is spending a lot of time in bed but they are able to get him up and be somewhat active.  He is drinking okay but his appetite has decreased slightly.   Observations/Objective: Patient sounds cheerful and well on the phone. I do not appreciate any SOB. Speech and thought processing are grossly intact. Patient reported vitals:  Assessment and Plan: 1. Failure to thrive in adult  - AMB Referral to [facility] - Amb Referral to Palliative Care   Follow Up Instructions:  DIAGMED@   [id number] 5-10 [id number] 11-20 9443 21-30 I did not refer this patient for an OV in the next 24 hours for this/these issue(s).  I discussed the assessment and treatment plan with the patient. The patient was provided an  opportunity to ask questions and all were answered. The patient agreed with the plan and demonstrated an understanding of the instructions.   The patient was advised to call back or seek an in-person evaluation if the symptoms worsen or if the condition fails to improve as anticipated.  I provided 15 minutes of non-face-to-face time during this encounter.   Dorothyann Peng, NP

## 2021-07-30 ENCOUNTER — Telehealth: Payer: Self-pay | Admitting: *Deleted

## 2021-07-30 NOTE — Chronic Care Management (AMB) (Signed)
Chronic Care Management   Note  07/30/2021 Name: Devin Hunter MRN: [id number] DOB: Dec 14, 1919  Devin Hunter is a 88 y.o. year old male who is a primary care patient of Dorothyann Peng, NP. I reached out to Devin Hunter by phone today in response to a referral sent by Devin Hunter's PCP.  [name] was given information about Chronic Care Management services today including:  CCM service includes personalized support from designated clinical staff supervised by his physician, including individualized plan of care and coordination with other care providers 24/7 contact phone numbers for assistance for urgent and routine care needs. Service will only be billed when office clinical staff spend 20 minutes or more in a month to coordinate care. Only one practitioner may furnish and bill the service in a calendar month. The patient may stop CCM services at any time (effective at the end of the month) by phone call to the office staff. The patient is responsible for co-pay (up to 20% after annual deductible is met) if co-pay is required by the individual health plan.   Patient agreed to services and verbal consent obtained.   Follow up plan: Telephone appointment with care management team member scheduled for:08/12/21  [facility]  Direct Dial: [phone]

## 2021-08-03 ENCOUNTER — Telehealth: Payer: Self-pay

## 2021-08-03 NOTE — Telephone Encounter (Signed)
Schedule in home palliative consult with daughter for 08/10/21 at 9 am due to telehealth limitations

## 2021-08-10 ENCOUNTER — Other Ambulatory Visit: Payer: Self-pay

## 2021-08-10 ENCOUNTER — Other Ambulatory Visit: Payer: Medicare Other | Admitting: Nurse Practitioner

## 2021-08-11 ENCOUNTER — Telehealth: Payer: Self-pay

## 2021-08-11 NOTE — Telephone Encounter (Signed)
115 pm.  Phone call made to daughter Bobbi Kozakiewicz to schedule a home visit.  No answer.  Message left requesting a call back.

## 2021-08-12 ENCOUNTER — Ambulatory Visit (INDEPENDENT_AMBULATORY_CARE_PROVIDER_SITE_OTHER): Payer: Medicare Other | Admitting: Licensed Clinical Social Worker

## 2021-08-12 ENCOUNTER — Telehealth: Payer: Self-pay

## 2021-08-12 DIAGNOSIS — I2510 Atherosclerotic heart disease of native coronary artery without angina pectoris: Secondary | ICD-10-CM

## 2021-08-12 DIAGNOSIS — I10 Essential (primary) hypertension: Secondary | ICD-10-CM

## 2021-08-12 NOTE — Telephone Encounter (Signed)
1011 am.  Return call made to [facility] of patient.  Visit is scheduled for next Tuesday at 1130 am.

## 2021-08-17 ENCOUNTER — Other Ambulatory Visit: Payer: Self-pay

## 2021-08-17 ENCOUNTER — Other Ambulatory Visit: Payer: Medicare Other

## 2021-08-17 VITALS — BP 112/54 | HR 72 | Temp 98.1°F | Resp 18

## 2021-08-17 DIAGNOSIS — Z515 Encounter for palliative care: Secondary | ICD-10-CM

## 2021-08-17 NOTE — Progress Notes (Signed)
PATIENT NAME: Devin Hunter DOB: 1920/05/31 MRN: [id number]  PRIMARY CARE PROVIDER: Dorothyann Peng, NP  RESPONSIBLE PARTY:  Acct ID - Guarantor Home Phone Work Phone Relationship Acct Type  [id number] KENDEL, BESSEY* [phone]  Self P/F     [address], [address], [place] [id number]    PLAN OF CARE and INTERVENTIONS:               1.  GOALS OF CARE/ ADVANCE CARE PLANNING:  Remain home under the care of his family.  ACP discussion completed with Inez Catalina and Mortimer Fries. Blank MOST and DNR forms left in the home.  Bobby requested I explain CPR and code status to patient.  Explained to patient and patient states "if it's my time to go then let me go".  Family would like to discuss more before completing forms.                2.  PATIENT/CAREGIVER EDUCATION:  Palliative Care vs Hospice               4. PERSONAL EMERGENCY PLAN:  Activate 911 for emergencies.                   5.  DISEASE STATUS:  Initial consult for patient.  Meet with Claud Kelp, Betty-daughter and patient.  Family provided background information on patient due to his short term memory deficits.  7 ED visits in 2022 related to urinary retention, foley cath issues and cough.  Family reports patient was seen by urology and started on proscar and silodosin.  Foley cath is no longer in place and patient is now incontinent of bowel and bladder.  No formal dx of dementia but family notes there are memory issues.  Patient does not remember day to day activities.  Unable to remember to take medications or eat.  Family members are coming into the home 2-3 x daily to assist with ADL's and check on patient.  He remains home alone during the nighttime hours.  Patient was driving 4-6 months ago but was stopped by family due to safety concerns.  Patient is cachectic and thin in appearance.  No nutritional supplements are being taken at this time.   Eating 2-3 meals a day but at times has to be fed by family. In 2014, patient weighed 141 lbs,most recent  weight is October 2022 was 131 lbs.  Measurements obtained today.  Right MAC 9 inches and Right thigh 15 inches.    Family is asking for additional support for patient.  We discussed MOW and family is agreeable.  I have notified Katheren Puller, SW and she will place a referral for MOW today.  Discussed hospice vs palliative care.   Family is open to hospice support if patient qualifies.  Will have case reviewed by Dr. Jewel Baize.    HISTORY OF PRESENT ILLNESS:  88 year old male with a past medical dx of Hyperlipidemia, HTN, CAD and elevated PSA level.  Patient is being followed by Palliative Care every 4-8 weeks and PRN.  CODE STATUS: Full ADVANCED DIRECTIVES: No MOST FORM: No PPS: 40%   PHYSICAL EXAM:   VITALS: Today's Vitals   08/17/21 1426  BP: (!) 112/54  Pulse: 72  Resp: 18  Temp: 98.1 F (36.7 C)  SpO2: 94%  PainSc: 0-No pain    LUNGS: clear to auscultation  CARDIAC: Cor RRR}  EXTREMITIES: - for edema SKIN: Skin color, texture, turgor normal. No rashes or lesions or mobility and turgor  normal  NEURO: positive for gait problems and memory problems       Lorenza Burton, RN

## 2021-08-19 ENCOUNTER — Other Ambulatory Visit: Payer: Medicare Other

## 2021-08-19 ENCOUNTER — Telehealth: Payer: Self-pay | Admitting: Adult Health

## 2021-08-19 ENCOUNTER — Other Ambulatory Visit: Payer: Self-pay

## 2021-08-19 VITALS — Wt 118.6 lb

## 2021-08-19 DIAGNOSIS — R627 Adult failure to thrive: Secondary | ICD-10-CM

## 2021-08-19 DIAGNOSIS — Z515 Encounter for palliative care: Secondary | ICD-10-CM

## 2021-08-19 NOTE — Telephone Encounter (Signed)
Please advise 

## 2021-08-19 NOTE — Progress Notes (Signed)
PATIENT NAME: Devin Hunter DOB: [birth year] MRN: [id number]  PRIMARY CARE PROVIDER: Dorothyann Peng, NP  RESPONSIBLE PARTY:  Acct ID - Guarantor Home Phone Work Phone Relationship Acct Type  [id number] MANOLO, BOSKET* [phone]  Self P/F     [address], [address], [place] [phone]    PLAN OF CARE and INTERVENTIONS:               1.  GOALS OF CARE/ ADVANCE CARE PLANNING:  Further discussion needs to be completed on code status and MOST form.  Discussed with family on Tuesday and left blank forms in the home.                2.  PATIENT/CAREGIVER EDUCATION:  Hospice Care               4. PERSONAL EMERGENCY PLAN:  Activate 911 for emergencies.               5.  DISEASE STATUS:  Home visit made to obtain a current weight for patient at the request of Dr. Jewel Baize.  Current weight is 118.6 lbs which represents a 12.4 lb weight loss in 4 months.  Son denies any recent falls.  Patient is wall/furniture surfing to maintain stable balance when walking short distances from his room to the living room.  Balance is unsteady when standing.  Update provided to Dr. Gilford Rile on weight.  Patient is eligible for hospice support.  1242 pm.  Phone call made to Olena Mater  with update of hospice eligibility.  Mortimer Fries is agreeable to proceed with hospice support.   1243 pm.  Phone call made to PCP office to request a hospice order and confirm NP will remain attending of record while patient is under hospice care.  Message taken by Joycelyn Schmid and will sent to PCP for response.    HISTORY OF PRESENT ILLNESS:  88 year old male with a past medical dx of Hyperlipidemia, HTN, CAD and elevated PSA level.  Patient is being followed by Palliative Care every 4-8 weeks and PRN.  CODE STATUS: Full ADVANCED DIRECTIVES: No MOST FORM: No PPS: 40%   PHYSICAL EXAM:   VITALS: Today's Vitals   08/19/21 1244  Weight: 118 lb 9.6 oz (53.8 kg)  PainSc: 0-No pain         Lorenza Burton, RN

## 2021-08-19 NOTE — Telephone Encounter (Signed)
Almyra Free from [facility] call and stated she need a Hospice order for pt and want to know will [name] still be the attending physician when pt is in Hospice care.Almyra Free # is [phone].

## 2021-08-19 NOTE — Telephone Encounter (Signed)
Verbal orders given to [facility] [place]

## 2021-08-23 ENCOUNTER — Telehealth: Payer: Self-pay

## 2021-08-23 ENCOUNTER — Telehealth: Payer: Self-pay | Admitting: Adult Health

## 2021-08-23 NOTE — Telephone Encounter (Signed)
845 am.  Phone call made to son Rebekah Sprinkle to follow up on desire for hospice support for patient.  No answer.  Message left requesting a call back.

## 2021-08-23 NOTE — Chronic Care Management (AMB) (Signed)
Chronic Care Management    Clinical Social Work Note  08/23/2021 Name: Devin Hunter MRN: [id number] DOB: 1919/10/07  Devin Hunter is a 88 y.o. year old male who is a primary care patient of Devin Peng, NP. The CCM team was consulted to assist the patient with chronic disease management and/or care coordination needs related to: Level of Care Concerns and Caregiver Stress.   Engaged with patient's daughter by telephone for initial visit in response to provider referral for social work chronic care management and care coordination services.   Consent to Services:  The patient was given the following information about Chronic Care Management services today, agreed to services, and gave verbal consent: 1. CCM service includes personalized support from designated clinical staff supervised by the primary care provider, including individualized plan of care and coordination with other care providers 2. 24/7 contact phone numbers for assistance for urgent and routine care needs. 3. Service will only be billed when office clinical staff spend 20 minutes or more in a month to coordinate care. 4. Only one practitioner may furnish and bill the service in a calendar month. 5.The patient may stop CCM services at any time (effective at the end of the month) by phone call to the office staff. 6. The patient will be responsible for cost sharing (co-pay) of up to 20% of the service fee (after annual deductible is met). Patient agreed to services and consent obtained.  Patient agreed to services and consent obtained.   Assessment: Review of patient past medical history, allergies, medications, and health status, including review of relevant consultants reports was performed today as part of a comprehensive evaluation and provision of chronic care management and care coordination services.     SDOH (Social Determinants of Health) assessments and interventions performed:    Advanced Directives Status: Not  addressed in this encounter.  CCM Care Plan  No Known Allergies  Outpatient Encounter Medications as of 08/12/2021  Medication Sig Note   aspirin 81 MG tablet Take 81 mg by mouth daily.    carvedilol (COREG) 3.125 MG tablet Take 1 tablet (3.125 mg total) by mouth 2 (two) times daily with a meal.    doxycycline (VIBRA-TABS) 100 MG tablet Take 100 mg by mouth 2 (two) times daily. (Patient not taking: Reported on 07/28/2021)    finasteride (PROSCAR) 5 MG tablet Take 5 mg by mouth daily.    furosemide (LASIX) 20 MG tablet Take 1 tablet (20 mg total) by mouth as needed.    latanoprost (XALATAN) 0.005 % ophthalmic solution Place 1 drop into both eyes at bedtime.    potassium chloride (KLOR-CON) 10 MEQ tablet TAKE 1 TABLET BY MOUTH EVERY OTHER DAY, TAKE WITH LASIX (Patient taking differently: 10 mEq See admin instructions. TAKE 1 TABLET BY MOUTH EVERY OTHER DAY, TAKE WITH LASIX)    silodosin (RAPAFLO) 4 MG CAPS capsule Take 4 mg by mouth at bedtime. (Patient not taking: Reported on 07/28/2021) 05/26/2021: Son wasn't able to verify   timolol (TIMOPTIC) 0.5 % ophthalmic solution Place 1 drop into both eyes every morning.    No facility-administered encounter medications on file as of 08/12/2021.    Patient Active Problem List   Diagnosis Date Noted   Erectile dysfunction 05/15/2014   CAD (coronary artery disease) 06/02/2013   HTN (hypertension) 06/02/2013   Hyperlipidemia with target LDL less than 70 06/02/2013   PSA elevation 06/02/2013    Conditions to be addressed/monitored: CAD and HTN; Level of care concerns  Care  Plan : LCSW Plan of Care  Updates made by Rebekah [place], LCSW since 08/23/2021 12:00 AM     Problem: Quality of Life (General Plan of Care)      Long-Range Goal: Quality of Life Maintained   Start Date: 08/12/2021  This Visit's Progress: On track  Priority: High  Note:   Current barriers:    Level of care concerns Clinical Goals: Patient will work with CCM LCSW to  address needs related to meeting mental and physical health needs Clinical Interventions:  Assessment of needs, barriers , agencies contacted, as well as how impacting. Patient's daughter provided all hx Patient's daughter shared needing assistance and/or recommendations of how to care for father and is interested in in-home aid CCM LCSW discussed in home agencies that can assist, family reports cant afford out of pocket costs Inez Catalina and brother provides care but they don't live together Patient is incontinent and doesn't do well with depends Lives independently (Son in law will visit daily) Family provides him food despite decrease in appetite CCM LCSW collaborated with Vance Gather regarding scheduling an initial appointment for a home visit Active listening / Reflection utilized  Emotional Support Provided Problem Staley strategies reviewed Caregiver stress acknowledged  Consideration of in-home help encouraged : options discussed Verbalization of feelings encouraged  Review various resources, discussed options and provided patient  1:1 collaboration with primary care provider regarding development and update of comprehensive plan of care as evidenced by provider attestation and co-signature Inter-disciplinary care team collaboration (see longitudinal plan of care) Patient Goals/Self-Care Activities: Over the next 120 days Attend all scheduled medical appointments Utilize healthy coping skills and/or supportive resources discussed Contact PCP office with any questions or concerns              Christa See, MSW, [email or web address] Phone [phone] 5:35 AM

## 2021-08-23 NOTE — Telephone Encounter (Signed)
[facility] is calling and the family would like cory to be hospice attending provider for this patient

## 2021-08-23 NOTE — Patient Instructions (Signed)
Visit Information  Thank you for taking time to visit with me today. Please don't hesitate to contact me if I can be of assistance to you before our next scheduled telephone appointment.  Following are the goals we discussed today:  Patient Goals/Self-Care Activities: Over the next 120 days Attend all scheduled medical appointments Utilize healthy coping skills and/or supportive resources discussed Contact PCP office with any questions or concerns  Our next appointment is by telephone on 09/02/21 at 10:00 AM  Please call the care guide team at [phone] if you need to cancel or reschedule your appointment.   If you are experiencing a Mental Health or [place] or need someone to talk to, please call the [place] National Suicide Prevention Lifeline: 647 688 2041 or TTY: 336 688 2225 TTY [phone]) to talk to a trained counselor call 911   Following is a copy of your full plan of care:  Care Plan : LCSW Plan of Care  Updates made by Devin Chesterfield, LCSW since 08/23/2021 12:00 AM     Problem: Quality of Life (General Plan of Care)      Long-Range Goal: Quality of Life Maintained   Start Date: 08/12/2021  This Visit's Progress: On track  Priority: High  Note:   Current barriers:    Level of care concerns Clinical Goals: Patient will work with CCM LCSW to address needs related to meeting mental and physical health needs Clinical Interventions:  Assessment of needs, barriers , agencies contacted, as well as how impacting. Patient's daughter provided all hx Patient's daughter shared needing assistance and/or recommendations of how to care for father and is interested in in-home aid CCM LCSW discussed in home agencies that can assist, family reports cant afford out of pocket costs Devin Hunter and brother provides care but they don't live together Patient is incontinent and doesn't do well with depends Lives independently (Son in law will visit daily) Family provides him  food despite decrease in appetite CCM LCSW collaborated with Vance Gather regarding scheduling an initial appointment for a home visit Active listening / Reflection utilized  Emotional Support Provided Problem [name] strategies reviewed Caregiver stress acknowledged  Consideration of in-home help encouraged : options discussed Verbalization of feelings encouraged  Review various resources, discussed options and provided patient  1:1 collaboration with primary care provider regarding development and update of comprehensive plan of care as evidenced by provider attestation and co-signature Inter-disciplinary care team collaboration (see longitudinal plan of care) Patient Goals/Self-Care Activities: Over the next 120 days Attend all scheduled medical appointments Utilize healthy coping skills and/or supportive resources discussed Contact PCP office with any questions or concerns             Devin Hunter was given information about Care Management services by the embedded care coordination team including:  Care Management services include personalized support from designated clinical staff supervised by his physician, including individualized plan of care and coordination with other care providers 24/7 contact phone numbers for assistance for urgent and routine care needs. The patient may stop CCM services at any time (effective at the end of the month) by phone call to the office staff.  Patient agreed to services and verbal consent obtained.   The patient verbalized understanding of instructions, educational materials, and care plan provided today and declined offer to receive copy of patient instructions, educational materials, and care plan.   Christa See, MSW, [email or web address] Phone [phone] 5:36  AM

## 2021-08-24 NOTE — Telephone Encounter (Signed)
Tammy informed of the message below.

## 2021-08-27 ENCOUNTER — Other Ambulatory Visit: Payer: Self-pay | Admitting: Cardiovascular Disease

## 2021-08-31 DIAGNOSIS — I2510 Atherosclerotic heart disease of native coronary artery without angina pectoris: Secondary | ICD-10-CM | POA: Diagnosis not present

## 2021-08-31 DIAGNOSIS — I10 Essential (primary) hypertension: Secondary | ICD-10-CM

## 2021-09-02 ENCOUNTER — Ambulatory Visit (INDEPENDENT_AMBULATORY_CARE_PROVIDER_SITE_OTHER): Payer: Medicare Other | Admitting: Licensed Clinical Social Worker

## 2021-09-02 DIAGNOSIS — I2510 Atherosclerotic heart disease of native coronary artery without angina pectoris: Secondary | ICD-10-CM

## 2021-09-02 DIAGNOSIS — I10 Essential (primary) hypertension: Secondary | ICD-10-CM

## 2021-09-03 ENCOUNTER — Other Ambulatory Visit: Payer: Medicare Other

## 2021-09-07 ENCOUNTER — Other Ambulatory Visit: Payer: Medicare Other | Admitting: Nurse Practitioner

## 2021-09-07 NOTE — Patient Instructions (Signed)
Visit Information ? ?Thank you for taking time to visit with me today. Please don't hesitate to contact me if I can be of assistance to you before our next scheduled telephone appointment. ? ?Following are the goals we discussed today:  ?Patient Goals/Self-Care Activities: Over the next 120 days ?Attend all scheduled medical appointments ?Utilize healthy coping skills and/or supportive resources discussed ?Contact PCP office with any questions or concerns ? ? ?If you are experiencing a Mental Health or [place] or need someone to talk to, please call the Suicide and Crisis Lifeline: 988 ?call 911  ? ?The patient verbalized understanding of instructions, educational materials, and care plan provided today and declined offer to receive copy of patient instructions, educational materials, and care plan.  ? ?Christa See, MSW, LCSW ?[facility] ?[facility] ?[email or web address] ?Phone [phone] ?5:57 AM ? ?

## 2021-09-07 NOTE — Chronic Care Management (AMB) (Signed)
?Chronic Care Management  ? ? Clinical Social Work Note ? ?09/07/2021 ?Name: Devin Hunter MRN: [id number] DOB: [birth year] ? ?DEUNDRA BARD is a 88 y.o. year old male who is a primary care patient of Dorothyann Peng, NP. The CCM team was consulted to assist the patient with chronic disease management and/or care coordination needs related to: Level of Care Concerns.  ? ?Engaged with patient's daughter by telephone for follow up visit in response to provider referral for social work chronic care management and care coordination services.  ? ?Consent to Services:  ?The patient was given information about Chronic Care Management services, agreed to services, and gave verbal consent prior to initiation of services.  Please see initial visit note for detailed documentation.  ? ?Patient agreed to services and consent obtained.  ? ?Summary:  Patient continues to maintain positive progress with care plan goals. He is receiving services through hospice. See Care Plan below for interventions and patient self-care activities. ? ?Recommendation: Patient may benefit from, and is in agreement with continuing to work with PCP to assist with management of health conditions.  ? ?Follow up Plan: All care plan goals have been met. Will disconnect from care team after this encounter, as pt is supported through hospice. Family have been informed to contact the office if new needs arise.  ? ?SDOH (Social Determinants of Health) assessments and interventions performed:   ? ?Advanced Directives Status: Not addressed in this encounter. ? ?CCM Care Plan ? ?No Known Allergies ? ?Outpatient Encounter Medications as of 09/02/2021  ?Medication Sig Note  ? aspirin 81 MG tablet Take 81 mg by mouth daily.   ? carvedilol (COREG) 3.125 MG tablet Take 1 tablet (3.125 mg total) by mouth 2 (two) times daily with a meal.   ? doxycycline (VIBRA-TABS) 100 MG tablet Take 100 mg by mouth 2 (two) times daily. (Patient not taking: Reported on 07/28/2021)   ?  finasteride (PROSCAR) 5 MG tablet Take 5 mg by mouth daily.   ? furosemide (LASIX) 20 MG tablet TAKE 1 TABLET BY MOUTH EVERY DAY AS NEEDED.   ? latanoprost (XALATAN) 0.005 % ophthalmic solution Place 1 drop into both eyes at bedtime.   ? potassium chloride (KLOR-CON) 10 MEQ tablet TAKE 1 TABLET BY MOUTH EVERY OTHER DAY, TAKE WITH LASIX (Patient taking differently: 10 mEq See admin instructions. TAKE 1 TABLET BY MOUTH EVERY OTHER DAY, TAKE WITH LASIX)   ? silodosin (RAPAFLO) 4 MG CAPS capsule Take 4 mg by mouth at bedtime. (Patient not taking: Reported on 07/28/2021) 05/26/2021: Son wasn't able to verify  ? timolol (TIMOPTIC) 0.5 % ophthalmic solution Place 1 drop into both eyes every morning.   ? ?No facility-administered encounter medications on file as of 09/02/2021.  ? ? ?Patient Active Problem List  ? Diagnosis Date Noted  ? Erectile dysfunction 05/15/2014  ? CAD (coronary artery disease) 06/02/2013  ? HTN (hypertension) 06/02/2013  ? Hyperlipidemia with target LDL less than 70 06/02/2013  ? PSA elevation 06/02/2013  ? ? ?Conditions to be addressed/monitored: CAD and HTN; Level of care concerns ? ?Care Plan : LCSW Plan of Care  ?Updates made by Rebekah Chesterfield, LCSW since 09/07/2021 12:00 AM  ?  ? ?Problem: Quality of Life (General Plan of Care)   ?  ? ?Long-Range Goal: Quality of Life Maintained Completed 09/02/2021  ?Start Date: 08/12/2021  ?This Visit's Progress: On track  ?Recent Progress: On track  ?Priority: High  ?Note:   ?Current barriers:   ?  Level of care concerns ?Clinical Goals: Patient will work with CCM LCSW to address needs related to meeting mental and physical health needs ?Clinical Interventions:  ?Assessment of needs, barriers , agencies contacted, as well as how impacting. Patient's daughter provided all hx ?Patient's daughter shared needing assistance and/or recommendations of how to care for father and is interested in in-home aid ?CCM LCSW discussed in home agencies that can assist, family reports  cant afford out of pocket costs ?[place] and brother provides care but they don't live together ?Patient is incontinent and doesn't do well with depends ?Lives independently (Son in law will visit daily) Family provides him food despite decrease in appetite ?CCM LCSW collaborated with Vance Gather regarding scheduling an initial appointment for a home visit 3/2: Patient is currently receiving hospice services through [facility]. Family are happy with level of support ?Active listening / Reflection utilized  ?Emotional Support Provided ?Problem Solving /Task Center strategies reviewed ?Caregiver stress acknowledged  ?Consideration of in-home help encouraged : options discussed ?Verbalization of feelings encouraged  ?Review various resources, discussed options and provided patient  ?1:1 collaboration with primary care provider regarding development and update of comprehensive plan of care as evidenced by provider attestation and co-signature ?Inter-disciplinary care team collaboration (see longitudinal plan of care) ?Patient Goals/Self-Care Activities: Over the next 120 days ?Attend all scheduled medical appointments ?Utilize healthy coping skills and/or supportive resources discussed ?Contact PCP office with any questions or concerns ?  ? ? ? ? ? ? ?  ?  ? ?Christa See, MSW, LCSW ?[facility] ?[facility] ?[email or web address] ?Phone [phone] ?5:54 AM ? ? ? ?

## 2021-10-01 DIAGNOSIS — I10 Essential (primary) hypertension: Secondary | ICD-10-CM

## 2021-10-01 DIAGNOSIS — I2510 Atherosclerotic heart disease of native coronary artery without angina pectoris: Secondary | ICD-10-CM

## 2021-11-01 DEATH — deceased

## 2022-05-02 IMAGING — DX DG CHEST 2V
3 series · 3 of 3 positions shown · non-contrast
Comparison: 09/05/2015

CLINICAL DATA: Shortness of breath and swelling in feet.

EXAM:
CHEST - 2 VIEW

[chest pa (1 of 2)]
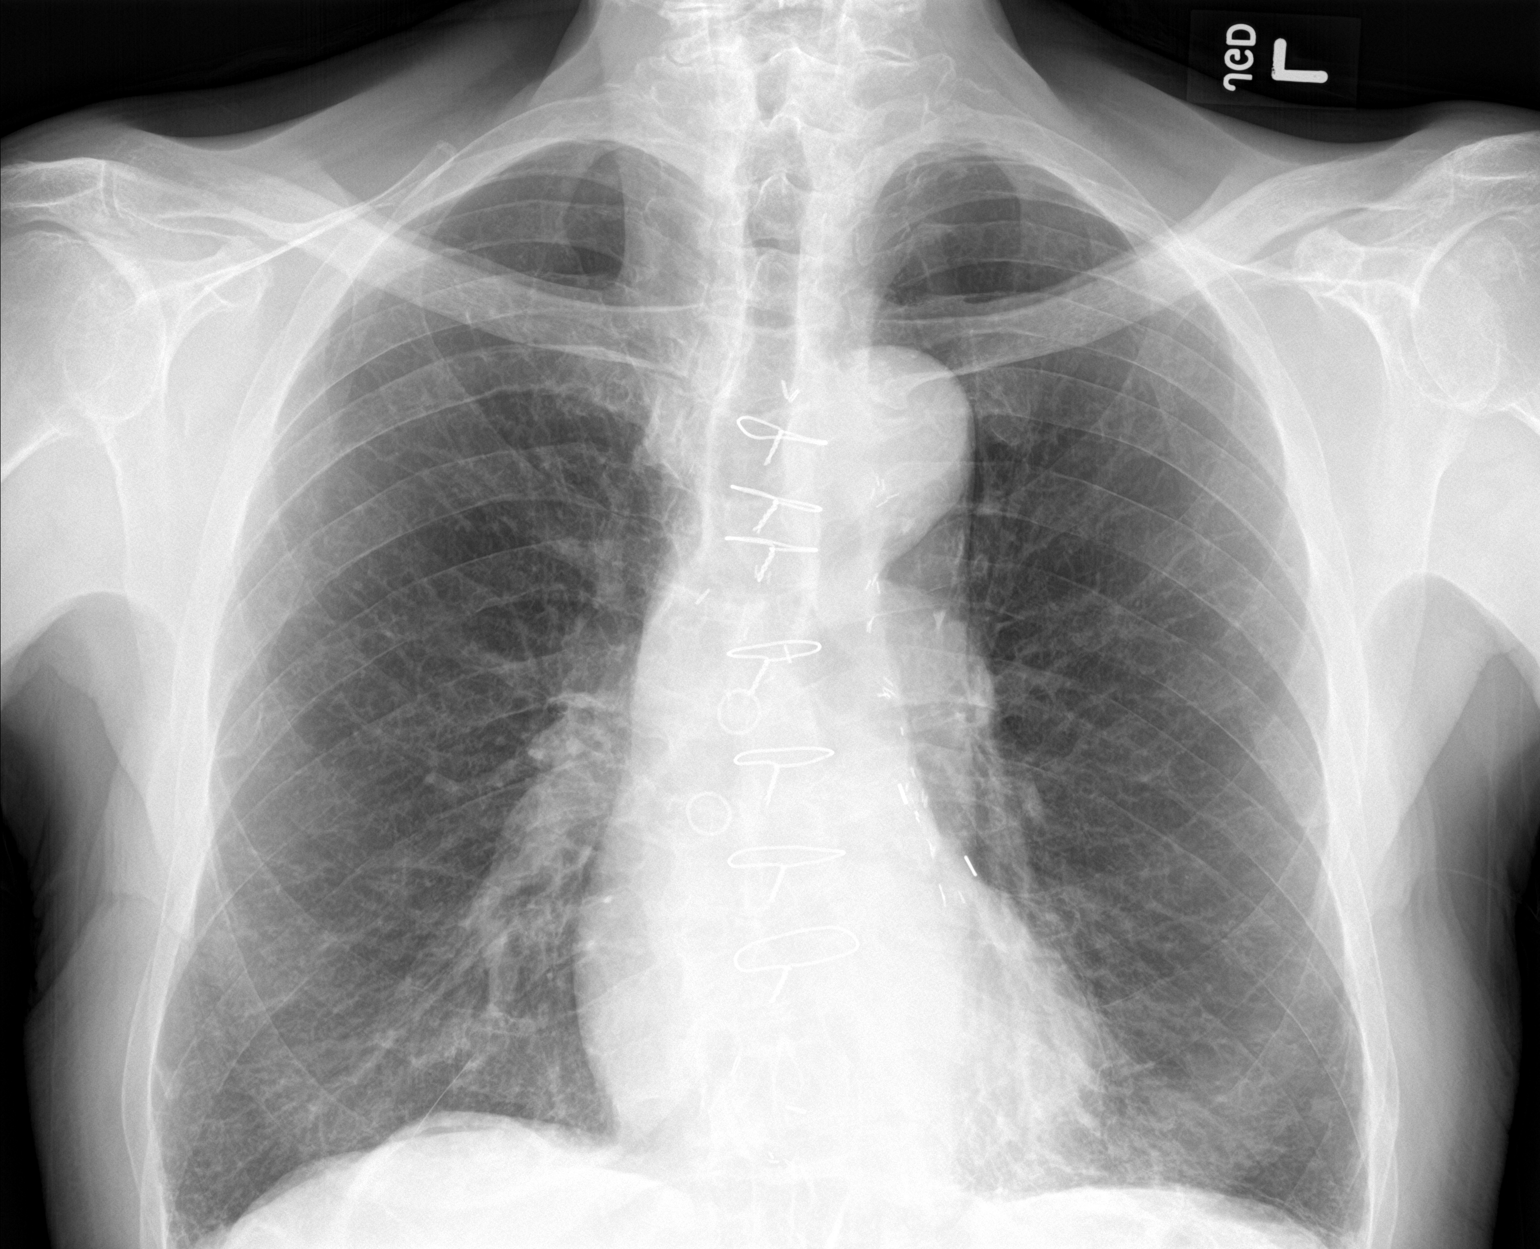

[chest lat]
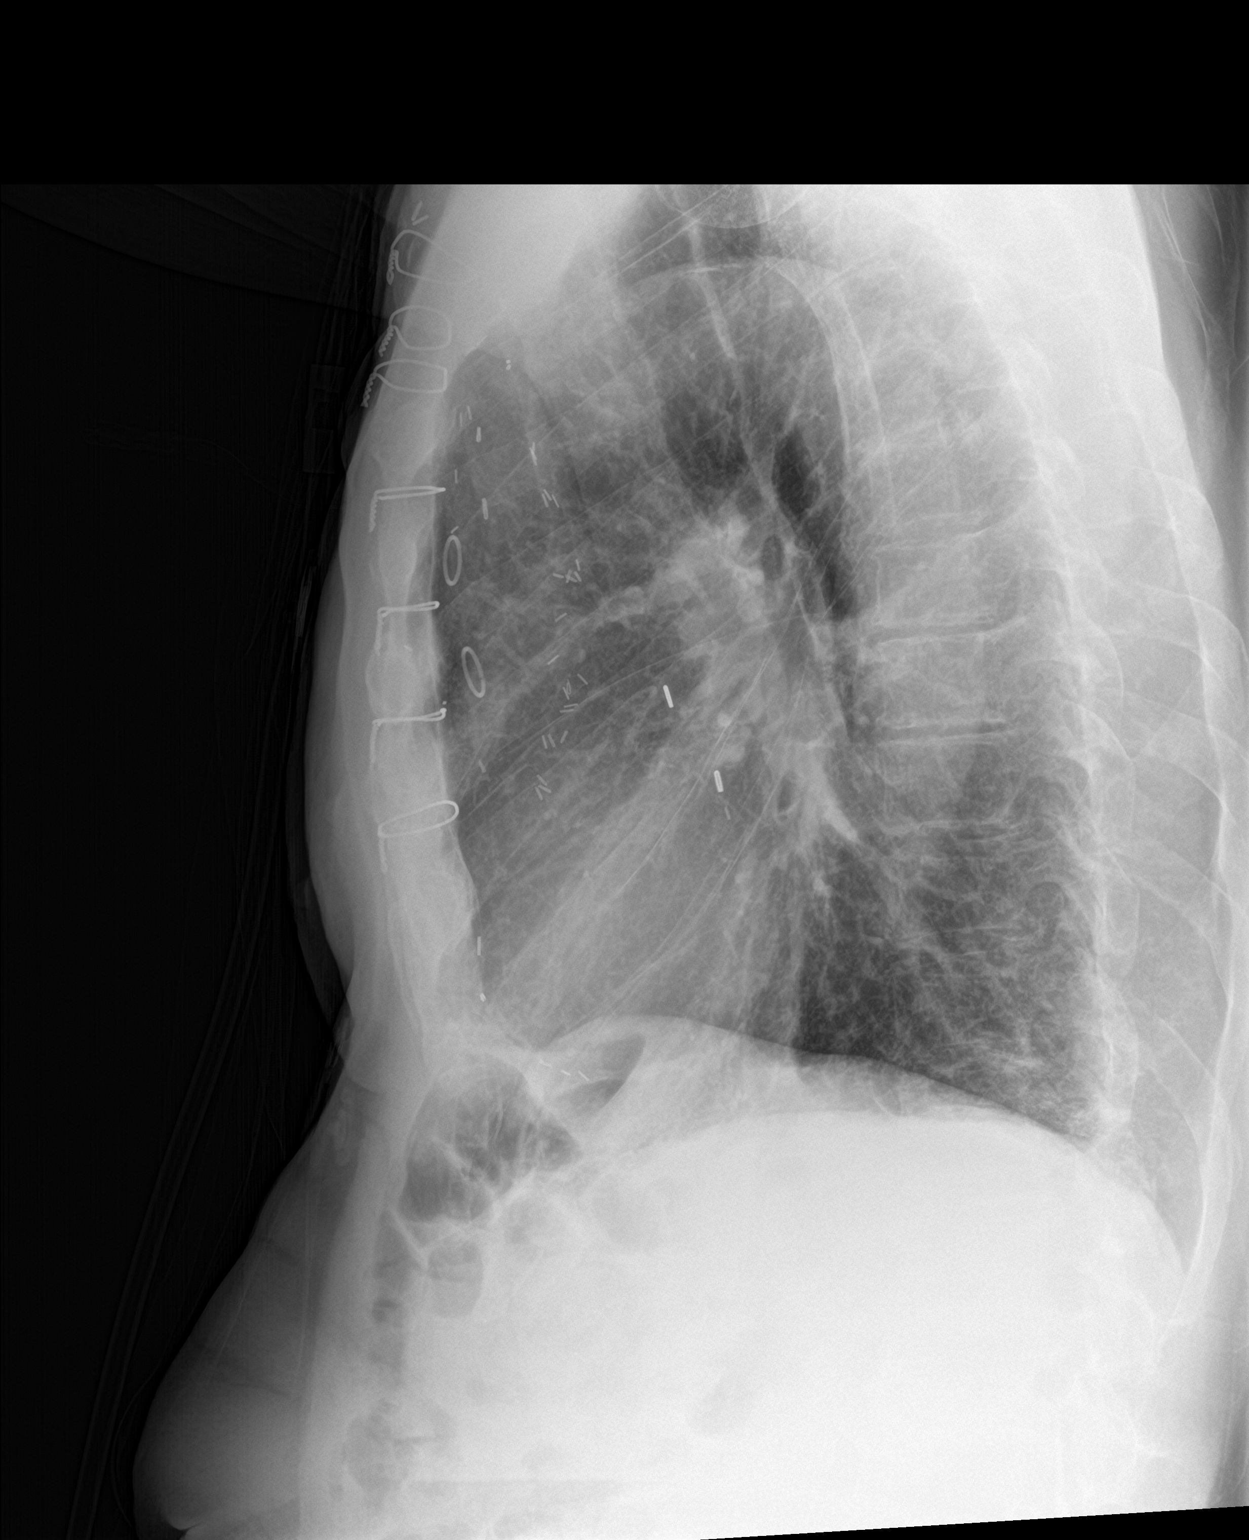

[chest pa (2 of 2)]
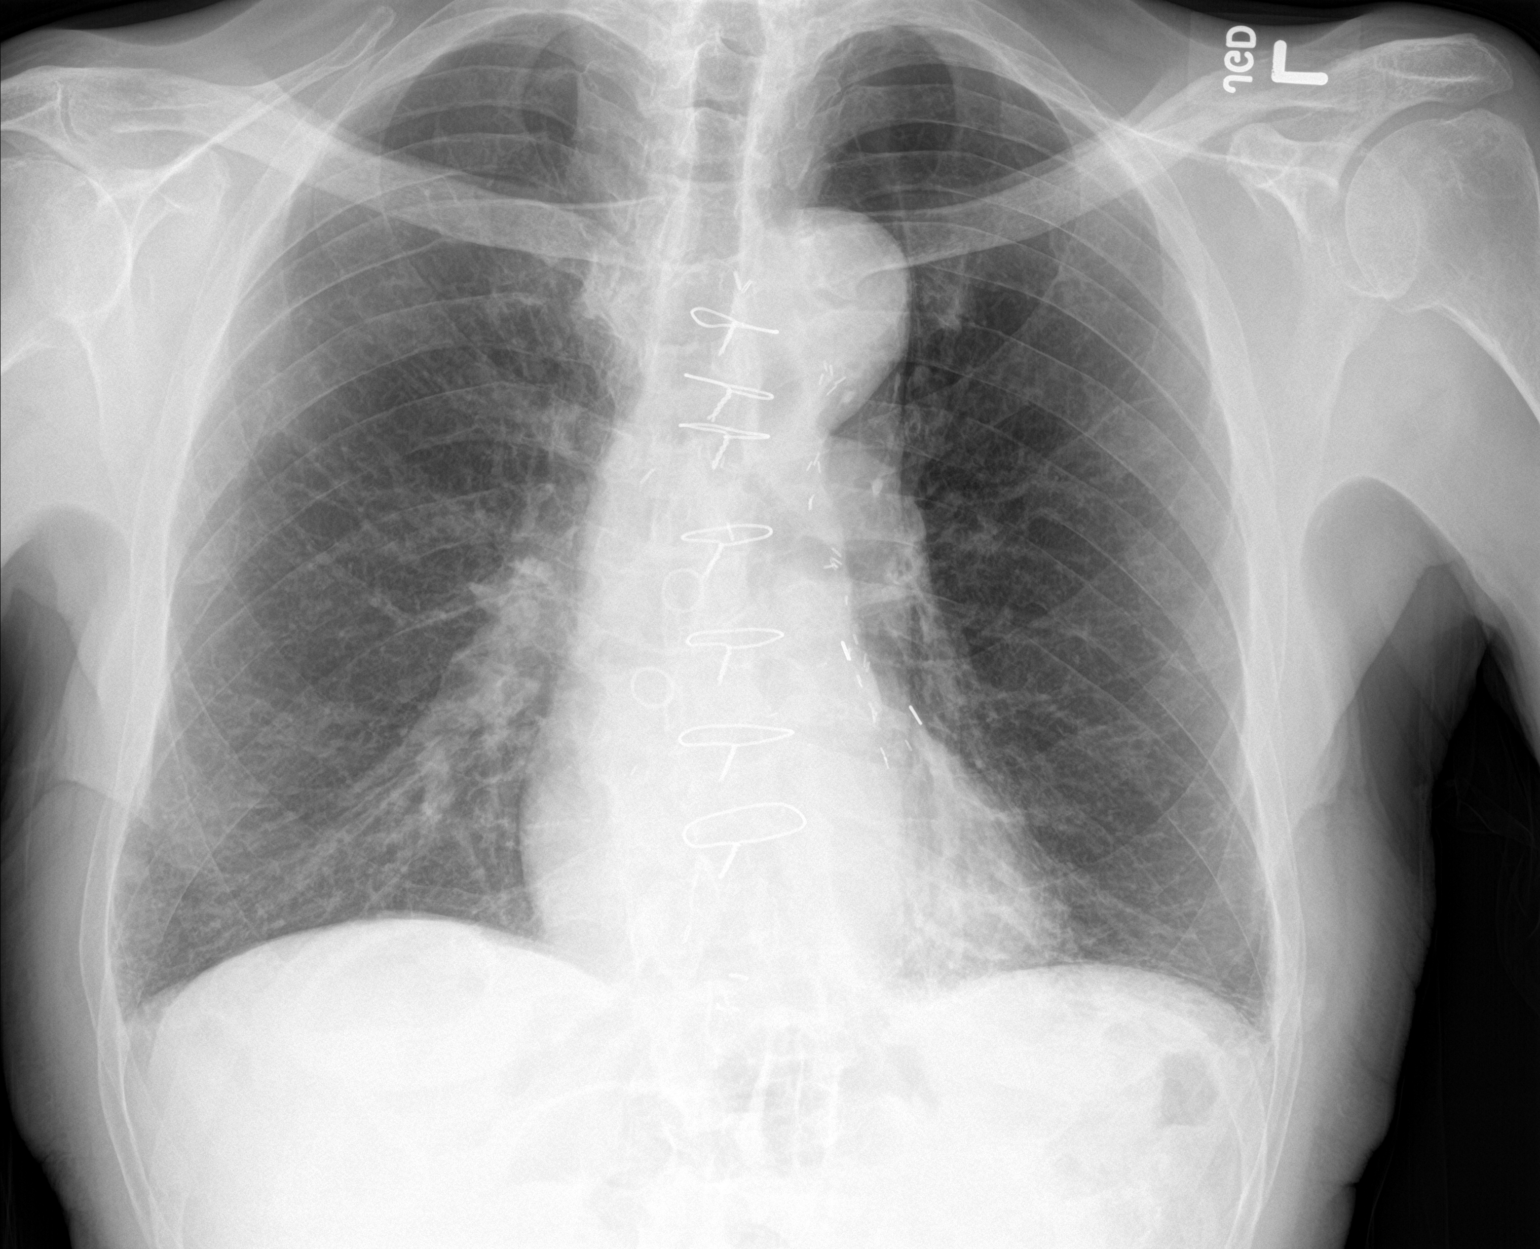

[3 of 3 positions shown; findings below may reference images not displayed]

FINDINGS: The cardiac silhouette, mediastinal and hilar contours are within
normal limits and stable. There is tortuosity of the thoracic aorta
which is stable. Stable surgical changes from coronary artery bypass
surgery.

No acute pulmonary findings. No worrisome pulmonary lesions or
pleural effusions. The bony thorax is intact.
IMPRESSION: No acute cardiopulmonary findings.

## 2022-08-23 IMAGING — CT CT RENAL STONE PROTOCOL
2 of 4 series · 16 of 46 positions shown, 18 images · non-contrast
Comparison: None.

CLINICAL DATA: Hematuria, inability to urinate

EXAM:
CT ABDOMEN AND PELVIS WITHOUT CONTRAST
TECHNIQUE: Multidetector CT imaging of the abdomen and pelvis was performed
following the standard protocol without IV contrast.

[Series 3: renal stone 5.0 · axial · 0.81mm/px · z∈[+686,+1041]mm · 13 of 77 slices shown, 15 images]
[im 3/77  soft-tissue]
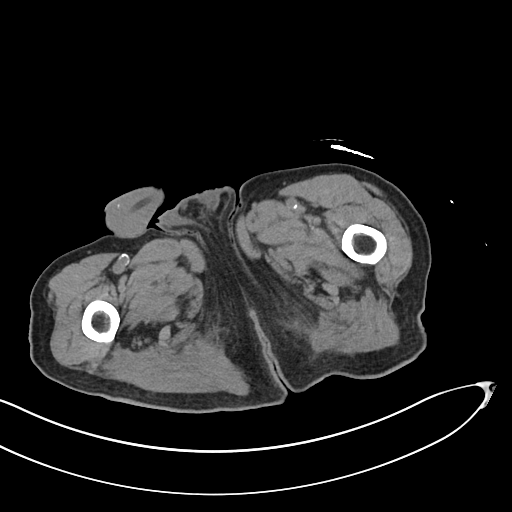
[im 3/77  bone]
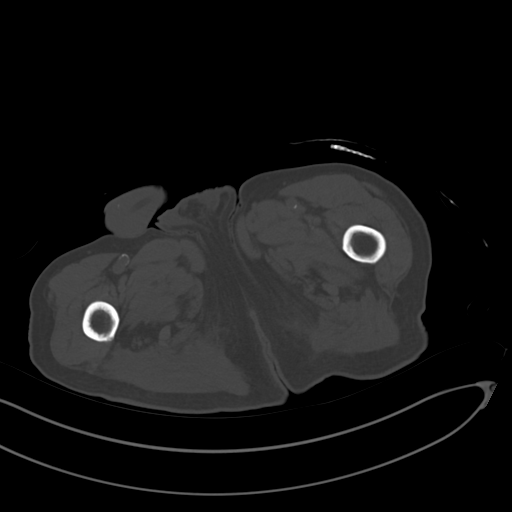
[im 9/77  soft-tissue]
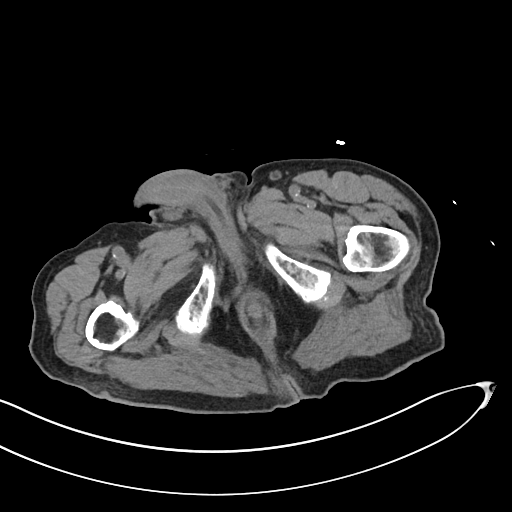
[im 15/77  soft-tissue]
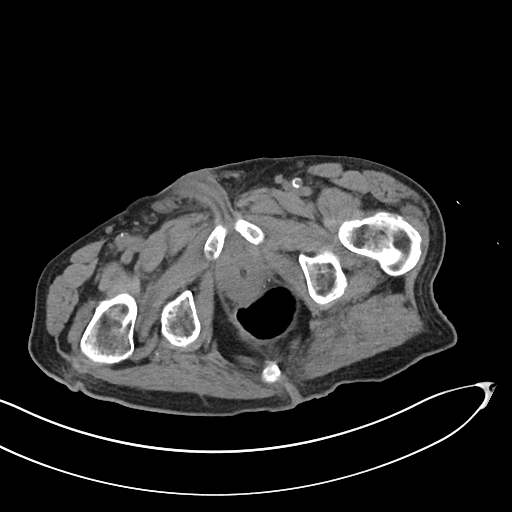
[im 21/77  soft-tissue]
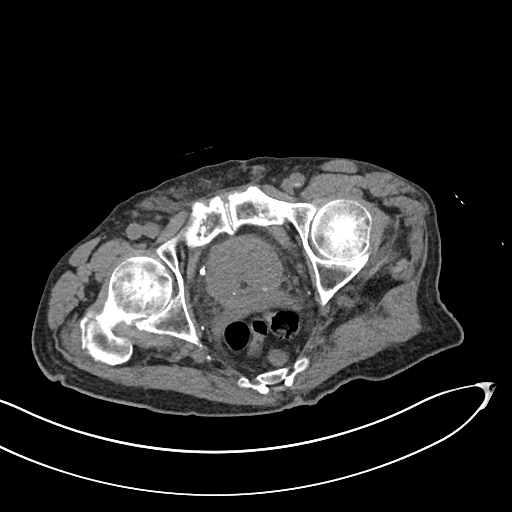
[im 27/77  soft-tissue]
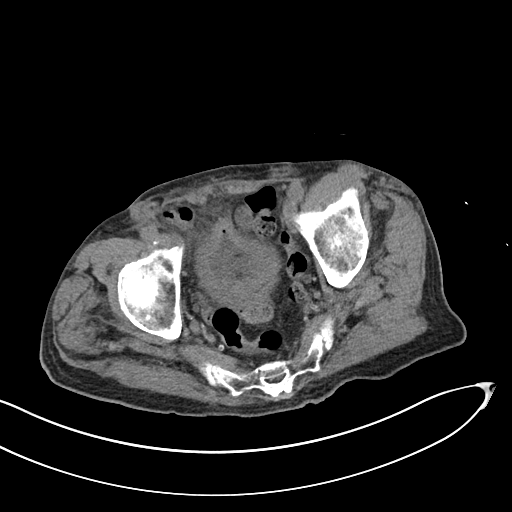
[im 33/77  soft-tissue]
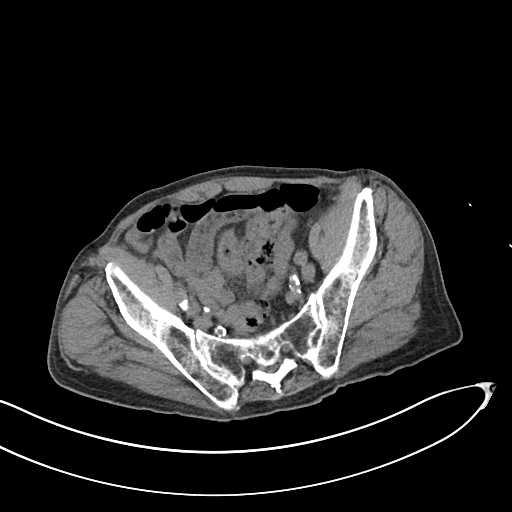
[im 39/77  soft-tissue]
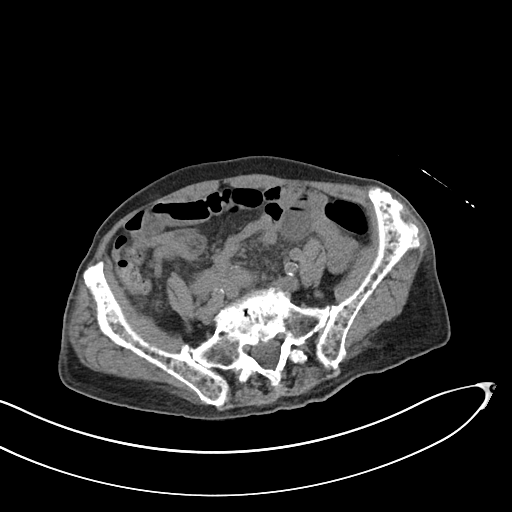
[im 44/77  soft-tissue]
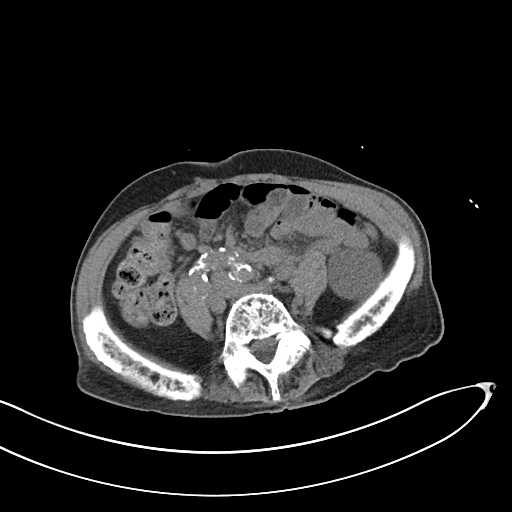
[im 50/77  soft-tissue]
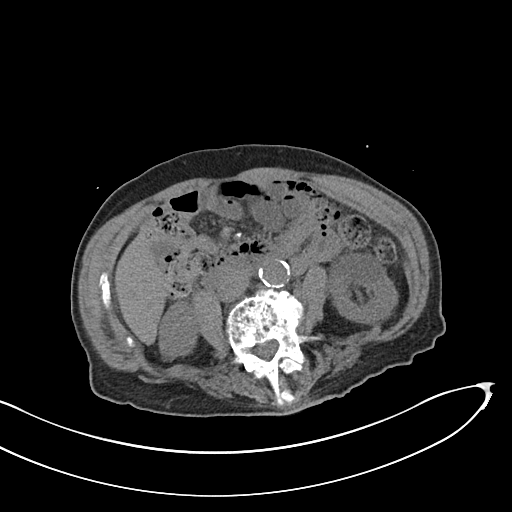
[im 50/77  bone]
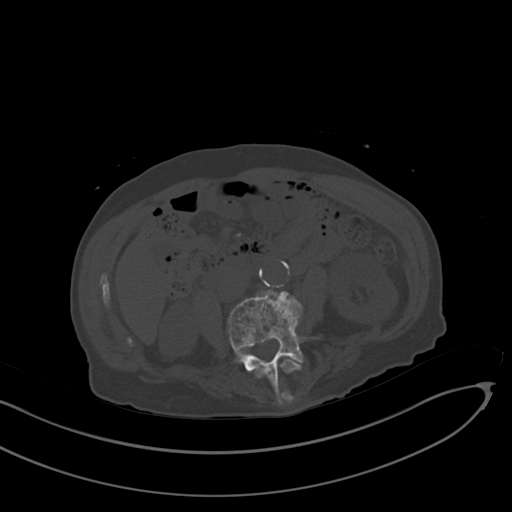
[im 56/77  soft-tissue]
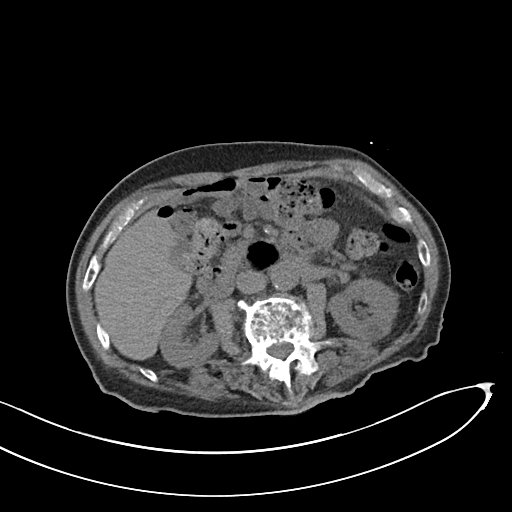
[im 62/77  soft-tissue]
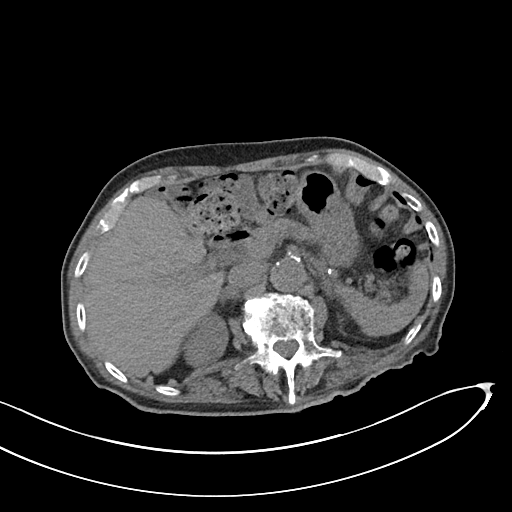
[im 68/77  soft-tissue]
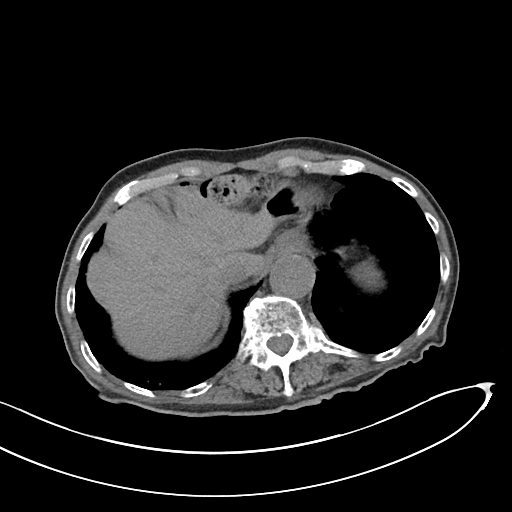
[im 74/77  soft-tissue]
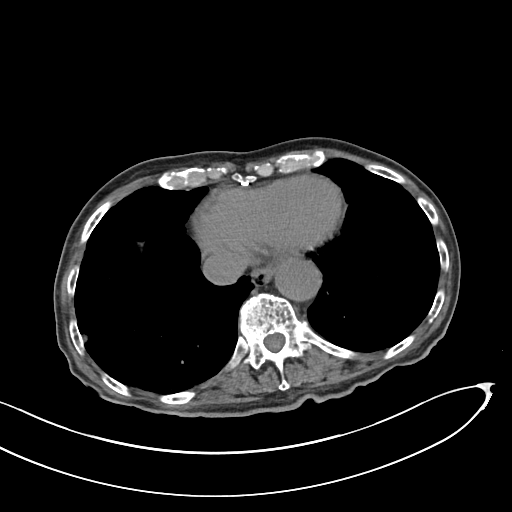

[Series 6: cor · coronal · 0.73mm/px · 3 of 147 slices shown]
[im 49/147  soft-tissue]
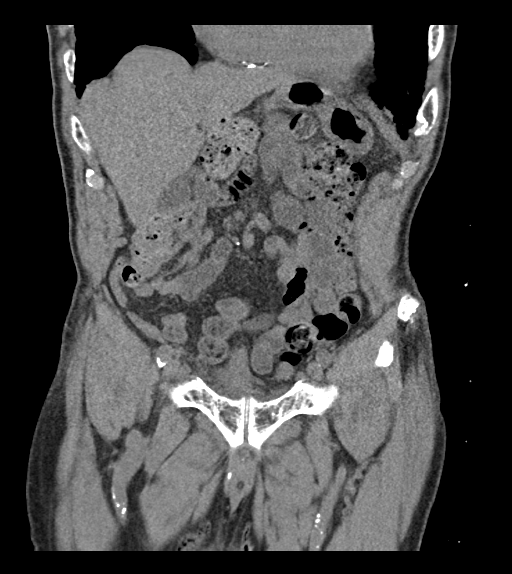
[im 65/147  soft-tissue]
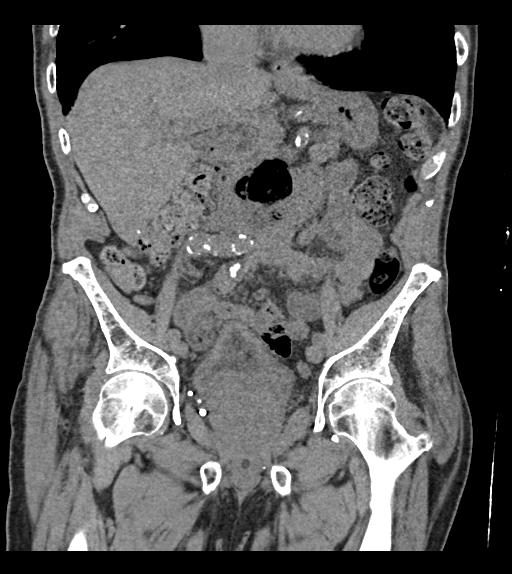
[im 82/147  soft-tissue]
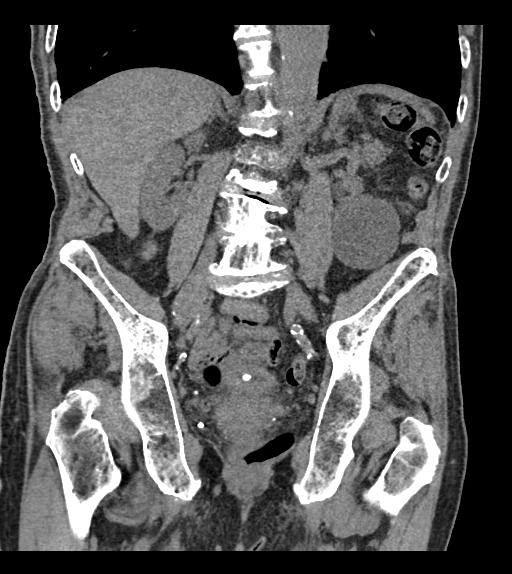

[16 of 46 positions shown; findings below may reference images not displayed]

FINDINGS: Lower chest: Emphysematous changes and scarring are seen at the lung
bases. There are bilateral lower lobe pulmonary nodules. On the
right, there is a 10 x 6 mm subpleural right lower lobe nodule
reference image [DATE]. On the left, there is a 5 x 5 mm left lower
lobe nodule reference image [DATE].

Hepatobiliary: No focal liver abnormality is seen. No gallstones,
gallbladder wall thickening, or biliary dilatation.

Pancreas: Unremarkable. No pancreatic ductal dilatation or
surrounding inflammatory changes.

Spleen: Normal in size without focal abnormality.

Adrenals/Urinary Tract: Punctate 2 mm nonobstructing right renal
calculus. Simple appearing left renal cysts. No obstructive uropathy
within either kidney.

Bladder is decompressed with a Foley catheter. No adrenal
abnormalities.

Stomach/Bowel: No bowel obstruction or ileus. No bowel wall
thickening or inflammatory change.

Vascular/Lymphatic: Aortic atherosclerosis. No enlarged abdominal or
pelvic lymph nodes.

Reproductive: There is marked enlargement the prostate, measuring
proximally 6.5 by 5.3 by 6.2 cm.

Other: No free fluid or free gas. There is a fat containing right
inguinal hernia. No bowel herniation.

Musculoskeletal: There are innumerable small lucencies identified
throughout the visualized skeletal structures involving the thoracic
cage, thoracolumbar spine, bony pelvis. Findings could reflect
diffuse multiple myeloma or metastatic disease. There are no acute
displaced fractures. Reconstructed images demonstrate no additional
findings.
IMPRESSION: 1. Markedly enlarged prostate.
2. Punctate 2 mm nonobstructing right renal calculus.
3. Innumerable small lucencies identified throughout the visualized
skeletal structures, concerning for diffuse myeloma involvement
versus metastatic disease.
4. Bilateral lower lobe pulmonary nodules, largest measuring mean
diameter 8 mm on the right.
Recommend a non-contrast Chest CT at 3-6 months, then another
non-contrast Chest CT at 18-24 months.
5.  Aortic Atherosclerosis ([id number]).
